# Patient Record
Sex: Female | Born: 1960
Health system: Southern US, Community
[De-identification: ages and names within clinical notes are randomized; demographics above are authoritative.]

## PROBLEM LIST (undated history)

## (undated) DIAGNOSIS — K449 Diaphragmatic hernia without obstruction or gangrene: Secondary | ICD-10-CM

## (undated) DIAGNOSIS — Z973 Presence of spectacles and contact lenses: Secondary | ICD-10-CM

## (undated) DIAGNOSIS — K219 Gastro-esophageal reflux disease without esophagitis: Secondary | ICD-10-CM

## (undated) DIAGNOSIS — N6019 Diffuse cystic mastopathy of unspecified breast: Secondary | ICD-10-CM

## (undated) DIAGNOSIS — L409 Psoriasis, unspecified: Secondary | ICD-10-CM

## (undated) DIAGNOSIS — Z8619 Personal history of other infectious and parasitic diseases: Secondary | ICD-10-CM

## (undated) DIAGNOSIS — C4499 Other specified malignant neoplasm of skin, unspecified: Secondary | ICD-10-CM

## (undated) DIAGNOSIS — Z8719 Personal history of other diseases of the digestive system: Secondary | ICD-10-CM

## (undated) HISTORY — DX: Psoriasis, unspecified: L40.9

## (undated) HISTORY — PX: SHOULDER ARTHROSCOPY W/ ROTATOR CUFF REPAIR: SHX2400

## (undated) HISTORY — PX: TUBAL LIGATION: SHX77

## (undated) HISTORY — DX: Diffuse cystic mastopathy of unspecified breast: N60.19

---

## 1978-02-20 HISTORY — PX: CHOLECYSTECTOMY OPEN: SUR202

## 1999-03-07 ENCOUNTER — Ambulatory Visit (HOSPITAL_BASED_OUTPATIENT_CLINIC_OR_DEPARTMENT_OTHER): Admission: RE | Admit: 1999-03-07 | Discharge: 1999-03-07 | Payer: Self-pay | Admitting: General Surgery

## 1999-03-07 ENCOUNTER — Encounter: Payer: Self-pay | Admitting: General Surgery

## 1999-03-07 HISTORY — PX: EXCISION OF BREAST BIOPSY: SHX5822

## 2001-01-22 ENCOUNTER — Other Ambulatory Visit: Admission: RE | Admit: 2001-01-22 | Discharge: 2001-01-22 | Payer: Self-pay | Admitting: Obstetrics and Gynecology

## 2001-01-23 ENCOUNTER — Encounter: Payer: Self-pay | Admitting: Obstetrics and Gynecology

## 2001-01-23 ENCOUNTER — Ambulatory Visit (HOSPITAL_COMMUNITY): Admission: RE | Admit: 2001-01-23 | Discharge: 2001-01-23 | Payer: Self-pay | Admitting: Neurology

## 2001-02-28 ENCOUNTER — Encounter: Payer: Self-pay | Admitting: Obstetrics and Gynecology

## 2001-02-28 ENCOUNTER — Ambulatory Visit (HOSPITAL_COMMUNITY): Admission: RE | Admit: 2001-02-28 | Discharge: 2001-02-28 | Payer: Self-pay | Admitting: Obstetrics and Gynecology

## 2002-03-13 ENCOUNTER — Ambulatory Visit (HOSPITAL_COMMUNITY): Admission: RE | Admit: 2002-03-13 | Discharge: 2002-03-13 | Payer: Self-pay | Admitting: Obstetrics and Gynecology

## 2002-03-13 ENCOUNTER — Encounter: Payer: Self-pay | Admitting: Obstetrics and Gynecology

## 2002-08-10 ENCOUNTER — Emergency Department (HOSPITAL_COMMUNITY): Admission: EM | Admit: 2002-08-10 | Discharge: 2002-08-10 | Payer: Self-pay | Admitting: *Deleted

## 2002-08-10 ENCOUNTER — Encounter: Payer: Self-pay | Admitting: *Deleted

## 2003-03-18 ENCOUNTER — Ambulatory Visit (HOSPITAL_COMMUNITY): Admission: RE | Admit: 2003-03-18 | Discharge: 2003-03-18 | Payer: Self-pay | Admitting: Obstetrics and Gynecology

## 2003-03-31 ENCOUNTER — Ambulatory Visit (HOSPITAL_COMMUNITY): Admission: RE | Admit: 2003-03-31 | Discharge: 2003-03-31 | Payer: Self-pay | Admitting: Pulmonary Disease

## 2003-07-15 ENCOUNTER — Encounter (HOSPITAL_COMMUNITY): Admission: RE | Admit: 2003-07-15 | Discharge: 2003-08-14 | Payer: Self-pay | Admitting: Orthopedic Surgery

## 2003-08-14 ENCOUNTER — Encounter (HOSPITAL_COMMUNITY): Admission: RE | Admit: 2003-08-14 | Discharge: 2003-09-13 | Payer: Self-pay | Admitting: Orthopedic Surgery

## 2003-12-16 ENCOUNTER — Ambulatory Visit (HOSPITAL_COMMUNITY): Admission: RE | Admit: 2003-12-16 | Discharge: 2003-12-16 | Payer: Self-pay | Admitting: Obstetrics & Gynecology

## 2004-04-06 ENCOUNTER — Ambulatory Visit (HOSPITAL_COMMUNITY): Admission: RE | Admit: 2004-04-06 | Discharge: 2004-04-06 | Payer: Self-pay | Admitting: Obstetrics and Gynecology

## 2005-04-24 ENCOUNTER — Ambulatory Visit (HOSPITAL_COMMUNITY): Admission: RE | Admit: 2005-04-24 | Discharge: 2005-04-24 | Payer: Self-pay | Admitting: Obstetrics and Gynecology

## 2005-06-30 ENCOUNTER — Encounter (INDEPENDENT_AMBULATORY_CARE_PROVIDER_SITE_OTHER): Payer: Self-pay | Admitting: Specialist

## 2005-06-30 ENCOUNTER — Ambulatory Visit (HOSPITAL_COMMUNITY): Admission: RE | Admit: 2005-06-30 | Discharge: 2005-06-30 | Payer: Self-pay | Admitting: Obstetrics & Gynecology

## 2005-06-30 HISTORY — PX: OTHER SURGICAL HISTORY: SHX169

## 2006-04-27 ENCOUNTER — Ambulatory Visit (HOSPITAL_COMMUNITY): Admission: RE | Admit: 2006-04-27 | Discharge: 2006-04-27 | Payer: Self-pay | Admitting: Obstetrics and Gynecology

## 2007-03-28 ENCOUNTER — Other Ambulatory Visit: Admission: RE | Admit: 2007-03-28 | Discharge: 2007-03-28 | Payer: Self-pay | Admitting: Obstetrics and Gynecology

## 2007-04-03 ENCOUNTER — Ambulatory Visit (HOSPITAL_COMMUNITY): Admission: RE | Admit: 2007-04-03 | Discharge: 2007-04-03 | Payer: Self-pay | Admitting: Obstetrics & Gynecology

## 2007-08-19 ENCOUNTER — Ambulatory Visit (HOSPITAL_COMMUNITY): Admission: RE | Admit: 2007-08-19 | Discharge: 2007-08-19 | Payer: Self-pay | Admitting: Obstetrics & Gynecology

## 2008-04-08 ENCOUNTER — Ambulatory Visit (HOSPITAL_COMMUNITY): Admission: RE | Admit: 2008-04-08 | Discharge: 2008-04-08 | Payer: Self-pay | Admitting: Obstetrics and Gynecology

## 2008-04-10 ENCOUNTER — Ambulatory Visit (HOSPITAL_COMMUNITY): Admission: RE | Admit: 2008-04-10 | Discharge: 2008-04-10 | Payer: Self-pay | Admitting: Obstetrics & Gynecology

## 2008-04-15 ENCOUNTER — Other Ambulatory Visit: Admission: RE | Admit: 2008-04-15 | Discharge: 2008-04-15 | Payer: Self-pay | Admitting: Obstetrics and Gynecology

## 2008-09-18 ENCOUNTER — Encounter: Admission: RE | Admit: 2008-09-18 | Discharge: 2008-09-18 | Payer: Self-pay | Admitting: Neurosurgery

## 2009-04-26 ENCOUNTER — Ambulatory Visit (HOSPITAL_COMMUNITY): Admission: RE | Admit: 2009-04-26 | Discharge: 2009-04-26 | Payer: Self-pay | Admitting: Obstetrics and Gynecology

## 2009-07-05 ENCOUNTER — Other Ambulatory Visit: Admission: RE | Admit: 2009-07-05 | Discharge: 2009-07-05 | Payer: Self-pay | Admitting: Obstetrics and Gynecology

## 2010-04-05 ENCOUNTER — Other Ambulatory Visit: Payer: Self-pay | Admitting: Obstetrics and Gynecology

## 2010-04-05 DIAGNOSIS — Z139 Encounter for screening, unspecified: Secondary | ICD-10-CM

## 2010-05-02 ENCOUNTER — Ambulatory Visit (HOSPITAL_COMMUNITY)
Admission: RE | Admit: 2010-05-02 | Discharge: 2010-05-02 | Disposition: A | Discharge status: Home / Self Care | Payer: BC Managed Care – PPO | Source: Ambulatory Visit | Attending: Obstetrics and Gynecology | Admitting: Obstetrics and Gynecology

## 2010-05-02 DIAGNOSIS — Z139 Encounter for screening, unspecified: Secondary | ICD-10-CM

## 2010-05-02 DIAGNOSIS — Z1231 Encounter for screening mammogram for malignant neoplasm of breast: Secondary | ICD-10-CM | POA: Insufficient documentation

## 2010-07-08 NOTE — Op Note (Signed)
[facility]  Patient:    Sarah Avila                         MRN: [id number] Proc. Date: 03/07/99 Adm. Date:  [id number] Attending:  Janalyn Rouse                           Operative Report  PREOPERATIVE DIAGNOSIS:  Abnormal left breast mammogram, with micro calcifications.  POSTOPERATIVE DIAGNOSIS:  Abnormal left breast mammogram, with micro calcification.  OPERATION:  Left breast biopsy with needle localization.  Specimen mammography.  SURGEON: Rose Phi. Maple Hudson, M.D.  ANESTHESIA:  MAC.  OPERATIVE PROCEDURE:  The patient was placed on the operating table and the left breast prepped and draped in the usual fashion.  A curvilinear incision was outlined around the previously placed wire, centered at about the 1:30 position at the left breast.  The area was then thoroughly infiltrated with 1% Xylocaine and adrenalin.  The incision was made and sharp dissection of the wire and surrounding tissue were excised.  Hemostasis obtained with cautery.  Specimen mammography confirmed the removal of the micro calcifications.  Subcuticular closure of 4.0 monocryl and Steri-Strips carried out.  Dressing applied.  The patient was transferred to the recovery room in satisfactory condition, having tolerated the procedure well. DD:  03/07/99 TD:  03/07/99 Job: [id number]

## 2010-07-08 NOTE — Op Note (Signed)
Sarah Avila, Sarah Avila               ACCOUNT NO.:  [id number]   MEDICAL RECORD NO.:  [id number]          PATIENT TYPE:  AMB   LOCATION:  DAY                           FACILITY:  [facility]   PHYSICIAN:  Lazaro Arms, M.D.   DATE OF BIRTH:  21-Aug-1960   DATE OF PROCEDURE:  06/30/2005  DATE OF DISCHARGE:                                 OPERATIVE REPORT   PREOPERATIVE DIAGNOSES:  1.  Menometrorrhagia  2.  Dysmenorrhea.   POSTOPERATIVE DIAGNOSES:  1.  Menometrorrhagia.  2.  Dysmenorrhea.   OPERATION PERFORMED:  1.  Hysteroscopy.  2.  Dilatation and curettage.  3.  Endometrial ablation.   SURGEON:  Lazaro Arms, M.D.   ANESTHESIA:  General endotracheal.   FINDINGS:  The patient had a normal endometrial cavity.  Slight uterine  septum.  Minimal tissue.   DESCRIPTION OF THE OPERATION:  The patient was brought to the operating room  and placed in the supine position.  She underwent general endotracheal  anesthesia.  She was placed in the dorsal lithotomy position, and was  prepped and draped in the usual sterile fashion.   A Graves speculum was placed.  The cervix was grasped.  A hysteroscopy was  performed.  There were normal findings as stated above.  Uterine curettage  was performed.  Endometrial ablation using ThermaChoice 3 balloon was used.  Thirteen milliliters was required.   Total therapy time was 8 minutes 55 seconds.   The patient tolerated the procedure well.   ESTIMATED BLOOD LOSS:  The patient experienced minimal blood loss.   DISPOSITION:  The patient was taken to recovery in good and stable  condition.   COUNTS:  All counts were correct.      Lazaro Arms, M.D.  Electronically Signed     LHE/MEDQ  D:  06/30/2005  T:  07/01/2005  Job:  [id number]

## 2010-07-13 ENCOUNTER — Other Ambulatory Visit (HOSPITAL_COMMUNITY)
Admission: RE | Admit: 2010-07-13 | Discharge: 2010-07-13 | Disposition: A | Discharge status: Home / Self Care | Payer: BC Managed Care – PPO | Source: Ambulatory Visit | Attending: Obstetrics and Gynecology | Admitting: Obstetrics and Gynecology

## 2010-07-13 ENCOUNTER — Other Ambulatory Visit: Payer: Self-pay | Admitting: Adult Health

## 2010-07-13 DIAGNOSIS — Z01419 Encounter for gynecological examination (general) (routine) without abnormal findings: Secondary | ICD-10-CM | POA: Insufficient documentation

## 2011-05-02 ENCOUNTER — Other Ambulatory Visit (HOSPITAL_COMMUNITY): Payer: Self-pay | Admitting: Internal Medicine

## 2011-05-02 DIAGNOSIS — Z139 Encounter for screening, unspecified: Secondary | ICD-10-CM

## 2011-05-08 ENCOUNTER — Ambulatory Visit (HOSPITAL_COMMUNITY)
Admission: RE | Admit: 2011-05-08 | Discharge: 2011-05-08 | Disposition: A | Discharge status: Home / Self Care | Payer: BC Managed Care – PPO | Source: Ambulatory Visit | Attending: Internal Medicine | Admitting: Internal Medicine

## 2011-05-08 DIAGNOSIS — Z139 Encounter for screening, unspecified: Secondary | ICD-10-CM

## 2011-05-08 DIAGNOSIS — Z1231 Encounter for screening mammogram for malignant neoplasm of breast: Secondary | ICD-10-CM | POA: Insufficient documentation

## 2011-07-18 ENCOUNTER — Other Ambulatory Visit (HOSPITAL_COMMUNITY)
Admission: RE | Admit: 2011-07-18 | Discharge: 2011-07-18 | Disposition: A | Discharge status: Home / Self Care | Payer: BC Managed Care – PPO | Source: Ambulatory Visit | Attending: Obstetrics and Gynecology | Admitting: Obstetrics and Gynecology

## 2011-07-18 ENCOUNTER — Other Ambulatory Visit: Payer: Self-pay | Admitting: Adult Health

## 2011-07-18 DIAGNOSIS — Z01419 Encounter for gynecological examination (general) (routine) without abnormal findings: Secondary | ICD-10-CM | POA: Insufficient documentation

## 2011-07-18 DIAGNOSIS — R87810 Cervical high risk human papillomavirus (HPV) DNA test positive: Secondary | ICD-10-CM | POA: Insufficient documentation

## 2011-12-28 ENCOUNTER — Encounter (HOSPITAL_COMMUNITY): Payer: Self-pay | Admitting: *Deleted

## 2011-12-28 ENCOUNTER — Emergency Department (HOSPITAL_COMMUNITY)
Admission: EM | Admit: 2011-12-28 | Discharge: 2011-12-28 | Disposition: A | Discharge status: Home / Self Care | Payer: Worker's Compensation | Attending: Emergency Medicine | Admitting: Emergency Medicine

## 2011-12-28 ENCOUNTER — Emergency Department (HOSPITAL_COMMUNITY): Payer: Worker's Compensation

## 2011-12-28 DIAGNOSIS — W3189XA Contact with other specified machinery, initial encounter: Secondary | ICD-10-CM | POA: Insufficient documentation

## 2011-12-28 DIAGNOSIS — S6720XA Crushing injury of unspecified hand, initial encounter: Secondary | ICD-10-CM

## 2011-12-28 DIAGNOSIS — Z23 Encounter for immunization: Secondary | ICD-10-CM | POA: Insufficient documentation

## 2011-12-28 DIAGNOSIS — S61219A Laceration without foreign body of unspecified finger without damage to nail, initial encounter: Secondary | ICD-10-CM

## 2011-12-28 DIAGNOSIS — F17200 Nicotine dependence, unspecified, uncomplicated: Secondary | ICD-10-CM | POA: Insufficient documentation

## 2011-12-28 DIAGNOSIS — Y9389 Activity, other specified: Secondary | ICD-10-CM | POA: Insufficient documentation

## 2011-12-28 DIAGNOSIS — Y9269 Other specified industrial and construction area as the place of occurrence of the external cause: Secondary | ICD-10-CM | POA: Insufficient documentation

## 2011-12-28 DIAGNOSIS — Y990 Civilian activity done for income or pay: Secondary | ICD-10-CM | POA: Insufficient documentation

## 2011-12-28 DIAGNOSIS — S61209A Unspecified open wound of unspecified finger without damage to nail, initial encounter: Secondary | ICD-10-CM | POA: Insufficient documentation

## 2011-12-28 MED ORDER — LIDOCAINE HCL (PF) 2 % IJ SOLN
10.0000 mL | Freq: Once | INTRAMUSCULAR | Status: AC
  Administered 2011-12-28: 10 mL
  Filled 2011-12-28 (×2): qty 10

## 2011-12-28 MED ORDER — TETANUS-DIPHTH-ACELL PERTUSSIS 5-2.5-18.5 LF-MCG/0.5 IM SUSP
0.5000 mL | Freq: Once | INTRAMUSCULAR | Status: AC
  Administered 2011-12-28: 0.5 mL via INTRAMUSCULAR
  Filled 2011-12-28: qty 0.5

## 2011-12-28 MED ORDER — HYDROCODONE-ACETAMINOPHEN 5-325 MG PO TABS: 1.0000 | ORAL_TABLET | Freq: Four times a day (QID) | ORAL | Status: AC | PRN

## 2011-12-28 MED ORDER — DOUBLE ANTIBIOTIC 500-10000 UNIT/GM EX OINT
TOPICAL_OINTMENT | Freq: Once | CUTANEOUS | Status: AC
  Administered 2011-12-28: 1 via TOPICAL
  Filled 2011-12-28: qty 1

## 2011-12-28 MED ORDER — NAPROXEN 500 MG PO TABS: 500.0000 mg | ORAL_TABLET | Freq: Two times a day (BID) | ORAL | Status: DC

## 2011-12-28 NOTE — ED Provider Notes (Signed)
LACERATION REPAIR Performed by: Burgess Amor Authorized by: Burgess Amor Consent: Verbal consent obtained. Risks and benefits: risks, benefits and alternatives were discussed Consent given by: patient Patient identity confirmed: provided demographic data Prepped and Draped in normal sterile fashion Wound explored  Laceration Location: left dorsal distal index finger  Laceration Length: 1cm  No Foreign Bodies seen or palpated  Anesthesia:   digital block Local anesthetic: lidocaine 2% without epinephrine  Anesthetic total: 2 ml  Irrigation method: syringe Amount of cleaning: standard  Skin closure: ethilon 4-0  Number of sutures: 3  Technique: simple interrupted  Patient tolerance: Patient tolerated the procedure well with no immediate complications.   Burgess Amor, [place] 12/28/11 [phone]

## 2011-12-28 NOTE — ED Provider Notes (Signed)
Medical screening examination/treatment/procedure(s) were conducted as a shared visit with non-physician practitioner(s) and myself.  I personally evaluated the patient during the encounter  Shelda Jakes, MD 12/28/11 2008

## 2011-12-28 NOTE — ED Notes (Signed)
Pt got left hand caught in a machine at work. Mashed left index & middle fingers.

## 2011-12-28 NOTE — ED Notes (Signed)
Patient with no complaints at this time. Respirations even and unlabored. Skin warm/dry. Discharge instructions reviewed with patient at this time. Patient given opportunity to voice concerns/ask questions. Patient discharged at this time and left Emergency Department with steady gait.   

## 2011-12-28 NOTE — ED Provider Notes (Addendum)
History     CSN: [id number]  Arrival date & time 12/28/11  0531   First MD Initiated Contact with Patient 12/28/11 [phone]      Chief Complaint  Patient presents with  . Finger Injury    (Consider location/radiation/quality/duration/timing/severity/associated sxs/prior treatment) The history is provided by the patient.   Patient is a 51 year old the female left hand dominant cut her left hand caught while at work between a [place] and a pulley the index finger and middle finger were crushed. And resulting in some lacerations. No other injuries. Patient's tetanus is not up-to-date double provided today. Patient states the pain is 6/10. Bleeding controlled. History reviewed. No pertinent past medical history.  Past Surgical History  Procedure Date  . Breast lumpectomy   . Cholecystectomy   . Tubal ligation     History reviewed. No pertinent family history.  History  Substance Use Topics  . Smoking status: Current Every Day Smoker    Types: Cigarettes  . Smokeless tobacco: Not on file  . Alcohol Use: No    OB History    Grav Para Term Preterm Abortions TAB SAB Ect Mult Living                  Review of Systems  Constitutional: Negative for fever.  HENT: Negative for neck pain.   Eyes: Negative for redness.  Respiratory: Negative for shortness of breath.   Cardiovascular: Negative for chest pain.  Gastrointestinal: Negative for abdominal pain.  Genitourinary: Negative for dysuria.  Musculoskeletal: Negative for back pain.  Skin: Positive for wound.  Neurological: Negative for headaches.  Hematological: Does not bruise/bleed easily.    Allergies  Review of patient's allergies indicates no known allergies.  Home Medications   Current Outpatient Rx  Name  Route  Sig  Dispense  Refill  . MULTI-VITAMIN/MINERALS PO TABS   Oral   Take 1 tablet by mouth daily.         Marland Kitchen HYDROCODONE-ACETAMINOPHEN 5-325 MG PO TABS   Oral   Take 1-2 tablets by mouth every 6  (six) hours as needed for pain.   10 tablet   0   . NAPROXEN 500 MG PO TABS   Oral   Take 1 tablet (500 mg total) by mouth 2 (two) times daily.   14 tablet   0     BP 117/93  Pulse 54  Temp 97.7 F (36.5 C) (Oral)  Resp 20  Ht 5\' 5"  (1.651 m)  Wt 165 lb (74.844 kg)  BMI 27.46 kg/m2  SpO2 100%  Physical Exam  Nursing note and vitals reviewed. Constitutional: She is oriented to person, place, and time. She appears well-developed and well-nourished.  HENT:  Head: Normocephalic and atraumatic.  Eyes: Conjunctivae normal and EOM are normal. Pupils are equal, round, and reactive to light.  Neck: Normal range of motion. Neck supple.  Cardiovascular: Normal rate, regular rhythm, normal heart sounds and intact distal pulses.   No murmur heard. Pulmonary/Chest: Effort normal and breath sounds normal.  Abdominal: Soft. Bowel sounds are normal. There is no tenderness.  Musculoskeletal: She exhibits tenderness.       Normal except for the injury to left hand. Crush injury to the index and middle finger. Lacerations on the dorsum of the hand. Index finger in the IP phalanx area has a 2 cm transverse laceration just distal to the PIP joint. Patient has good range of motion no tendon involvement. Good cap refill distally and sensation is intact. That is  to both fingers. Multiple other superficial lacerations not requiring suturing.  Neurological: She is alert and oriented to person, place, and time. No cranial nerve deficit. She exhibits normal muscle tone. Coordination normal.  Skin: Skin is warm.    ED Course  Procedures (including critical care time)  Labs Reviewed - No data to display Dg Hand Complete Left  12/28/2011  *RADIOLOGY REPORT*  Clinical Data: Trauma, laceration  LEFT HAND - COMPLETE 3+ VIEW  Comparison: None.  Findings: No fracture or dislocation.  No aggressive osseous lesions.  No radiopaque foreign body.  IMPRESSION: No acute osseous abnormality of the left hand.    Original Report Authenticated By: Jearld Lesch, M.D.      1. Hand crush injury   2. Laceration of finger       MDM  Crush injury to left hand. This occurred at work. The left index and middle fingers were entrapped on a compared belt in between a pulley. No evidence of bony injury. Superficial lacerations to both fingers. Left index finger on the IP phalanx has a 2 cm transverse laceration not involving the tendons and will require suture.x-ray showed no evidence of fracture.  Suture repair will be done by the mid-level. This will be a shared visit.  Sutures can be removed in 7-10 days. Orthopedic referral provided as needed. The patient also has primary care Dr. She can followup with wound care discussed.  Suture repair to be done by the mid-level as stated above. Tetanus given in the emergency department.      Medical screening examination/treatment/procedure(s) were conducted as a shared visit with non-physician practitioner(s) and myself.  I personally evaluated the patient during the encounter        Shelda Jakes, MD 12/28/11 1610  Shelda Jakes, MD 12/28/11 9604  Shelda Jakes, MD 12/28/11 2007

## 2012-04-29 ENCOUNTER — Other Ambulatory Visit: Payer: Self-pay | Admitting: Obstetrics and Gynecology

## 2012-04-29 DIAGNOSIS — Z139 Encounter for screening, unspecified: Secondary | ICD-10-CM

## 2012-05-09 ENCOUNTER — Ambulatory Visit (HOSPITAL_COMMUNITY)
Admission: RE | Admit: 2012-05-09 | Discharge: 2012-05-09 | Disposition: A | Discharge status: Home / Self Care | Payer: BC Managed Care – PPO | Source: Ambulatory Visit | Attending: Obstetrics and Gynecology | Admitting: Obstetrics and Gynecology

## 2012-05-09 DIAGNOSIS — Z1231 Encounter for screening mammogram for malignant neoplasm of breast: Secondary | ICD-10-CM | POA: Insufficient documentation

## 2012-05-09 DIAGNOSIS — Z139 Encounter for screening, unspecified: Secondary | ICD-10-CM

## 2012-08-06 ENCOUNTER — Other Ambulatory Visit: Payer: Self-pay | Admitting: Adult Health

## 2012-08-07 ENCOUNTER — Encounter: Payer: Self-pay | Admitting: *Deleted

## 2012-08-08 ENCOUNTER — Encounter: Payer: Self-pay | Admitting: Adult Health

## 2012-08-08 ENCOUNTER — Ambulatory Visit (INDEPENDENT_AMBULATORY_CARE_PROVIDER_SITE_OTHER): Payer: BC Managed Care – PPO | Admitting: Adult Health

## 2012-08-08 ENCOUNTER — Other Ambulatory Visit (HOSPITAL_COMMUNITY)
Admission: RE | Admit: 2012-08-08 | Discharge: 2012-08-08 | Disposition: A | Discharge status: Home / Self Care | Payer: BC Managed Care – PPO | Source: Ambulatory Visit | Attending: Adult Health | Admitting: Adult Health

## 2012-08-08 VITALS — BP 124/86 | HR 74 | Ht 65.5 in | Wt 174.0 lb

## 2012-08-08 DIAGNOSIS — F17200 Nicotine dependence, unspecified, uncomplicated: Secondary | ICD-10-CM | POA: Insufficient documentation

## 2012-08-08 DIAGNOSIS — Z1151 Encounter for screening for human papillomavirus (HPV): Secondary | ICD-10-CM | POA: Insufficient documentation

## 2012-08-08 DIAGNOSIS — Z1212 Encounter for screening for malignant neoplasm of rectum: Secondary | ICD-10-CM

## 2012-08-08 DIAGNOSIS — Z01419 Encounter for gynecological examination (general) (routine) without abnormal findings: Secondary | ICD-10-CM

## 2012-08-08 DIAGNOSIS — B977 Papillomavirus as the cause of diseases classified elsewhere: Secondary | ICD-10-CM

## 2012-08-08 LAB — LIPID PANEL
Cholesterol: 198 mg/dL (ref 0–200)
HDL: 50 mg/dL (ref >39)
LDL Cholesterol: 139 mg/dL — ABNORMAL HIGH (ref 0–99)
Total CHOL/HDL Ratio: 4 Ratio
Triglycerides: 45 mg/dL (ref <150)
VLDL: 9 mg/dL (ref 0–40)

## 2012-08-08 LAB — CBC
HCT: 38.1 % (ref 36.0–46.0)
Hemoglobin: 13.3 g/dL (ref 12.0–15.0)
MCH: 30.4 pg (ref 26.0–34.0)
MCHC: 34.9 g/dL (ref 30.0–36.0)
MCV: 87 fL (ref 78.0–100.0)
Platelets: 176 10*3/uL (ref 150–400)
RBC: 4.38 MIL/uL (ref 3.87–5.11)
RDW: 14.9 % (ref 11.5–15.5)
WBC: 5.6 10*3/uL (ref 4.0–10.5)

## 2012-08-08 LAB — COMPREHENSIVE METABOLIC PANEL
ALT: 15 U/L (ref 0–35)
AST: 19 U/L (ref 0–37)
Albumin: 4.1 g/dL (ref 3.5–5.2)
Alkaline Phosphatase: 44 U/L (ref 39–117)
BUN: 20 mg/dL (ref 6–23)
CO2: 27 mEq/L (ref 19–32)
Calcium: 9.3 mg/dL (ref 8.4–10.5)
Chloride: 104 mEq/L (ref 96–112)
Creat: 0.73 mg/dL (ref 0.50–1.10)
Glucose, Bld: 81 mg/dL (ref 70–99)
Potassium: 4.5 mEq/L (ref 3.5–5.3)
Sodium: 141 mEq/L (ref 135–145)
Total Bilirubin: 1.2 mg/dL (ref 0.3–1.2)
Total Protein: 6.2 g/dL (ref 6.0–8.3)

## 2012-08-08 LAB — HEMOCCULT GUIAC POC 1CARD (OFFICE): Fecal Occult Blood, POC: NEGATIVE

## 2012-08-08 LAB — TSH: TSH: 0.858 u[IU]/mL (ref 0.350–4.500)

## 2012-08-08 MED ORDER — BUPROPION HCL ER (SR) 150 MG PO TB12: ORAL_TABLET | ORAL | Status: DC

## 2012-08-08 NOTE — Patient Instructions (Addendum)
Physical in 1 year  Mammogram yearly Colonoscopy 2021 Call about labs

## 2012-08-08 NOTE — Progress Notes (Signed)
Patient ID: Sarah Avila, female   DOB: 10-May-1960, 52 y.o.   MRN: [id number] History of Present Illness: Sarah Avila is a 52 year old white female in for her pap and physical. She is working 12 hour shifts.Has occasional hot flash. Last year's pap +HPV  Current Medications, Allergies, Past Medical History, Past Surgical History, Family History and Social History were reviewed in [employer] record.     Review of Systems: Patient denies any headaches, blurred vision, shortness of breath, chest pain, abdominal pain, problems with bowel movements, urination, or intercourse, some occasional discomfort but uses lubricant.No joint pain some stress at work, wants to quit smoking.    Physical Exam:BP 124/86  Pulse 74  Ht 5' 5.5" (1.664 m)  Wt 174 lb (78.926 kg)  BMI 28.5 kg/m2 General:  Well developed, well nourished, no acute distress Skin:  Warm and dry,tan Neck:  Midline trachea, normal thyroid Lungs; Clear to auscultation bilaterally Breast:  No dominant palpable mass, retraction, or nipple discharge Cardiovascular: Regular rate and rhythm Abdomen:  Soft, non tender, no hepatosplenomegaly Pelvic:  External genitalia is normal in appearance.  The vagina is normal in appearance. She has a mild cystocele.The cervix is bulbous.Pap performed with HPV  Uterus is felt to be normal size, shape, and contour.  No   adnexal masses or tenderness noted. Rectal: Good sphincter tone, no polyps, or hemorrhoids felt.  Hemoccult negative. Extremities:  No swelling noted Psych:  Alert and cooperative and seems happy   Impression: Yearly gyn exam History HPV Nicotine addiction   Plan: Physical in 1 year  Mammogram yearly Colonoscopy in 2021 Rx Wellbutrin 150 mg SR 1 daily #30 with 6 refills. Check CBC,CMP,TSH and lipids

## 2012-08-16 ENCOUNTER — Telehealth: Payer: Self-pay | Admitting: Adult Health

## 2012-08-16 NOTE — Telephone Encounter (Signed)
Pt aware pap normal no HPV

## 2012-12-26 ENCOUNTER — Other Ambulatory Visit: Payer: Self-pay

## 2013-03-22 ENCOUNTER — Other Ambulatory Visit: Payer: Self-pay | Admitting: Adult Health

## 2013-04-15 ENCOUNTER — Other Ambulatory Visit: Payer: Self-pay | Admitting: Obstetrics and Gynecology

## 2013-04-15 DIAGNOSIS — Z139 Encounter for screening, unspecified: Secondary | ICD-10-CM

## 2013-05-12 ENCOUNTER — Ambulatory Visit (HOSPITAL_COMMUNITY)
Admission: RE | Admit: 2013-05-12 | Discharge: 2013-05-12 | Disposition: A | Discharge status: Home / Self Care | Payer: BC Managed Care – PPO | Source: Ambulatory Visit | Attending: Obstetrics and Gynecology | Admitting: Obstetrics and Gynecology

## 2013-05-12 DIAGNOSIS — Z139 Encounter for screening, unspecified: Secondary | ICD-10-CM

## 2013-05-12 DIAGNOSIS — Z1231 Encounter for screening mammogram for malignant neoplasm of breast: Secondary | ICD-10-CM | POA: Insufficient documentation

## 2013-12-22 ENCOUNTER — Encounter: Payer: Self-pay | Admitting: Adult Health

## 2014-05-20 ENCOUNTER — Other Ambulatory Visit: Payer: Self-pay | Admitting: Obstetrics and Gynecology

## 2014-05-20 DIAGNOSIS — Z1231 Encounter for screening mammogram for malignant neoplasm of breast: Secondary | ICD-10-CM

## 2014-05-27 ENCOUNTER — Encounter (HOSPITAL_COMMUNITY): Payer: Self-pay

## 2014-05-27 ENCOUNTER — Ambulatory Visit (HOSPITAL_COMMUNITY)
Admission: RE | Admit: 2014-05-27 | Discharge: 2014-05-27 | Disposition: A | Discharge status: Home / Self Care | Payer: 59 | Source: Ambulatory Visit | Attending: Obstetrics and Gynecology | Admitting: Obstetrics and Gynecology

## 2014-05-27 DIAGNOSIS — Z1231 Encounter for screening mammogram for malignant neoplasm of breast: Secondary | ICD-10-CM | POA: Diagnosis present

## 2014-08-10 ENCOUNTER — Ambulatory Visit (INDEPENDENT_AMBULATORY_CARE_PROVIDER_SITE_OTHER): Payer: 59 | Admitting: Adult Health

## 2014-08-10 ENCOUNTER — Encounter: Payer: Self-pay | Admitting: Adult Health

## 2014-08-10 VITALS — BP 140/80 | HR 76 | Ht 65.0 in | Wt 191.5 lb

## 2014-08-10 DIAGNOSIS — Z01419 Encounter for gynecological examination (general) (routine) without abnormal findings: Secondary | ICD-10-CM | POA: Diagnosis not present

## 2014-08-10 DIAGNOSIS — Z1212 Encounter for screening for malignant neoplasm of rectum: Secondary | ICD-10-CM

## 2014-08-10 LAB — HEMOCCULT GUIAC POC 1CARD (OFFICE): Fecal Occult Blood, POC: NEGATIVE

## 2014-08-10 NOTE — Patient Instructions (Signed)
Pap and physical in 1 year Mammogram yearly Labs with PCP Colonoscopy at 24

## 2014-08-10 NOTE — Progress Notes (Signed)
Patient ID: Sarah Avila, female   DOB: 05-Dec-1960, 54 y.o.   MRN: [id number] History of Present Illness:  Sarah Avila is a 54 year old white female, in for well woman gyn exam,had normal pap with negative HPV 08/08/12. PCP is Dr Sarah Avila.  Current Medications, Allergies, Past Medical History, Past Surgical History, Family History and Social History were reviewed in [employer] record.     Review of Systems: Patient denies any headaches, hearing loss, blurred vision, shortness of breath, chest pain, abdominal pain, problems with bowel movements, urination, or intercourse. No joint pain or mood swings.Pt is tired at times, had labs with PCP.Has occasional hot flash.    Physical Exam:BP 140/80 mmHg  Pulse 76  Ht 5\' 5"  (1.651 m)  Wt 191 lb 8 oz (86.864 kg)  BMI 31.87 kg/m2 General:  Well developed, well nourished, no acute distress Skin:  Warm and dry Neck:  Midline trachea, normal thyroid, good ROM, no lymphadenopathy Lungs; Clear to auscultation bilaterally Breast:  No dominant palpable mass, retraction, or nipple discharge Cardiovascular: Regular rate and rhythm Abdomen:  Soft, non tender, no hepatosplenomegaly Pelvic:  External genitalia is normal in appearance, no lesions.  The vagina is normal in appearance. Urethra has no lesions or masses. The cervix is bulbous.  Uterus is felt to be normal size, shape, and contour.  No adnexal masses or tenderness noted.Bladder is non tender, no masses felt. Rectal: Good sphincter tone, no polyps, or hemorrhoids felt.  Hemoccult negative. Extremities/musculoskeletal:  No swelling or varicosities noted, no clubbing or cyanosis Psych:  No mood changes, alert and cooperative,seems happy   Impression: Well woman gyn exam no pap    Plan: Pap and physical in 1 year Mammogram yearly Colonoscopy at 67  Labs with PCP

## 2015-03-31 ENCOUNTER — Ambulatory Visit (INDEPENDENT_AMBULATORY_CARE_PROVIDER_SITE_OTHER): Payer: 59 | Admitting: Adult Health

## 2015-03-31 ENCOUNTER — Encounter: Payer: Self-pay | Admitting: Adult Health

## 2015-03-31 VITALS — BP 130/72 | HR 70 | Ht 64.75 in | Wt 199.0 lb

## 2015-03-31 DIAGNOSIS — K5900 Constipation, unspecified: Secondary | ICD-10-CM | POA: Insufficient documentation

## 2015-03-31 DIAGNOSIS — R1032 Left lower quadrant pain: Secondary | ICD-10-CM | POA: Diagnosis not present

## 2015-03-31 NOTE — Patient Instructions (Signed)
Pelvic Pain, Female Female pelvic pain can be caused by many different things and start from a variety of places. Pelvic pain refers to pain that is located in the lower half of the abdomen and between your hips. The pain may occur over a short period of time (acute) or may be reoccurring (chronic). The cause of pelvic pain may be related to disorders affecting the female reproductive organs (gynecologic), but it may also be related to the bladder, kidney stones, an intestinal complication, or muscle or skeletal problems. Getting help right away for pelvic pain is important, especially if there has been severe, sharp, or a sudden onset of unusual pain. It is also important to get help right away because some types of pelvic pain can be life threatening.  CAUSES  Below are only some of the causes of pelvic pain. The causes of pelvic pain can be in one of several categories.   Gynecologic.  Pelvic inflammatory disease.  Sexually transmitted infection.  Ovarian cyst or a twisted ovarian ligament (ovarian torsion).  Uterine lining that grows outside the uterus (endometriosis).  Fibroids, cysts, or tumors.  Ovulation.  Pregnancy.  Pregnancy that occurs outside the uterus (ectopic pregnancy).  Miscarriage.  Labor.  Abruption of the placenta or ruptured uterus.  Infection.  Uterine infection (endometritis).  Bladder infection.  Diverticulitis.  Miscarriage related to a uterine infection (septic abortion).  Bladder.  Inflammation of the bladder (cystitis).  Kidney stone(s).  Gastrointestinal.  Constipation.  Diverticulitis.  Neurologic.  Trauma.  Feeling pelvic pain because of mental or emotional causes (psychosomatic).  Cancers of the bowel or pelvis. EVALUATION  Your caregiver will want to take a careful history of your concerns. This includes recent changes in your health, a careful gynecologic history of your periods (menses), and a sexual history. Obtaining  your family history and medical history is also important. Your caregiver may suggest a pelvic exam. A pelvic exam will help identify the location and severity of the pain. It also helps in the evaluation of which organ system may be involved. In order to identify the cause of the pelvic pain and be properly treated, your caregiver may order tests. These tests may include:   A pregnancy test.  Pelvic ultrasonography.  An X-ray exam of the abdomen.  A urinalysis or evaluation of vaginal discharge.  Blood tests. HOME CARE INSTRUCTIONS   Only take over-the-counter or prescription medicines for pain, discomfort, or fever as directed by your caregiver.   Rest as directed by your caregiver.   Eat a balanced diet.   Drink enough fluids to make your urine clear or pale yellow, or as directed.   Avoid sexual intercourse if it causes pain.   Apply warm or cold compresses to the lower abdomen depending on which one helps the pain.   Avoid stressful situations.   Keep a journal of your pelvic pain. Write down when it started, where the pain is located, and if there are things that seem to be associated with the pain, such as food or your menstrual cycle.  Follow up with your caregiver as directed.  SEEK MEDICAL CARE IF:  Your medicine does not help your pain.  You have abnormal vaginal discharge. SEEK IMMEDIATE MEDICAL CARE IF:   You have heavy bleeding from the vagina.   Your pelvic pain increases.   You feel light-headed or faint.   You have chills.   You have pain with urination or blood in your urine.   You have uncontrolled  diarrhea or vomiting.   You have a fever or persistent symptoms for more than 3 days.  You have a fever and your symptoms suddenly get worse.   You are being physically or sexually abused.   This information is not intended to replace advice given to you by your health care provider. Make sure you discuss any questions you have with  your health care provider.   Document Released: 01/04/2004 Document Revised: 10/28/2014 Document Reviewed: 05/29/2011 Elsevier Interactive Patient Education [employer]. Return in 1 day for gyn [place]

## 2015-03-31 NOTE — Progress Notes (Signed)
Subjective:     Patient ID: Sarah Avila, female   DOB: 11-Dec-1960, 55 y.o.   MRN: [id number]  HPI Sarah Avila is a 55 year old white female in complaining of pain in LLQ for about 3 months, she denies any nausea, vomiting or diarrhea, she has had some constipation.  Review of Systems Patient denies any headaches, hearing loss, fatigue, blurred vision, shortness of breath, chest pain, problems with  urination, or intercourse. No joint pain or mood swings. See HPI for positives. Reviewed past medical,surgical, social and family history. Reviewed medications and allergies.     Objective:   Physical Exam BP 130/72 mmHg  Pulse 70  Ht 5' 4.75" (1.645 m)  Wt 199 lb (90.266 kg)  BMI 33.36 kg/m2   Skin warm and dry.Pelvic: external genitalia is normal in appearance no lesions, vagina:pale with loss of moisture and rugae,urethra has no lesions or masses noted, cervix:smooth and bulbous,with relaxation, uterus: normal size, shape and contour, non tender, no masses felt, adnexa: no masses, LLQ tenderness noted. Bladder is non tender and no masses felt. Abdomen soft and non tender, no HSM, no CVAT.Had colonoscopy at 55.Will get [place] ASAP to assess ovaries and uterus. Face time 15 minutes.  Assessment:     LLQ pain Constipation     Plan:     Return in 1 day for gyn [place] Review handout on pelvic pain

## 2015-04-01 ENCOUNTER — Ambulatory Visit (INDEPENDENT_AMBULATORY_CARE_PROVIDER_SITE_OTHER): Payer: 59

## 2015-04-01 DIAGNOSIS — R1032 Left lower quadrant pain: Secondary | ICD-10-CM | POA: Diagnosis not present

## 2015-04-01 DIAGNOSIS — N854 Malposition of uterus: Secondary | ICD-10-CM

## 2015-04-01 NOTE — Progress Notes (Signed)
PELVIC US TA/TV homogenous anteverted uterus,EEC 2.63mm, .4 x .3 x .3cm calcification in the LUS w/in the endometrial cavity,normal ov's bilat (mobile),no free fluid,no pain

## 2015-04-05 ENCOUNTER — Telehealth: Payer: Self-pay | Admitting: Adult Health

## 2015-04-05 NOTE — Telephone Encounter (Signed)
Pt aware US normal  

## 2015-04-05 NOTE — Telephone Encounter (Signed)
Pt called stating that she would like a phone call from jennifer regarding her results of her ultrasound. Please contact pt

## 2015-05-31 ENCOUNTER — Other Ambulatory Visit: Payer: Self-pay | Admitting: Obstetrics and Gynecology

## 2015-05-31 DIAGNOSIS — Z1231 Encounter for screening mammogram for malignant neoplasm of breast: Secondary | ICD-10-CM

## 2015-06-07 ENCOUNTER — Ambulatory Visit (HOSPITAL_COMMUNITY)
Admission: RE | Admit: 2015-06-07 | Discharge: 2015-06-07 | Disposition: A | Discharge status: Home / Self Care | Payer: 59 | Source: Ambulatory Visit | Attending: Obstetrics and Gynecology | Admitting: Obstetrics and Gynecology

## 2015-06-07 DIAGNOSIS — Z1231 Encounter for screening mammogram for malignant neoplasm of breast: Secondary | ICD-10-CM

## 2015-11-05 ENCOUNTER — Other Ambulatory Visit: Payer: 59 | Admitting: Adult Health

## 2016-01-07 ENCOUNTER — Encounter (INDEPENDENT_AMBULATORY_CARE_PROVIDER_SITE_OTHER): Payer: Self-pay | Admitting: Internal Medicine

## 2016-01-19 ENCOUNTER — Encounter (INDEPENDENT_AMBULATORY_CARE_PROVIDER_SITE_OTHER): Payer: Self-pay | Admitting: *Deleted

## 2016-01-19 ENCOUNTER — Other Ambulatory Visit (INDEPENDENT_AMBULATORY_CARE_PROVIDER_SITE_OTHER): Payer: Self-pay | Admitting: Internal Medicine

## 2016-01-19 ENCOUNTER — Encounter (INDEPENDENT_AMBULATORY_CARE_PROVIDER_SITE_OTHER): Payer: Self-pay

## 2016-01-19 ENCOUNTER — Encounter (INDEPENDENT_AMBULATORY_CARE_PROVIDER_SITE_OTHER): Payer: Self-pay | Admitting: Internal Medicine

## 2016-01-19 ENCOUNTER — Ambulatory Visit (INDEPENDENT_AMBULATORY_CARE_PROVIDER_SITE_OTHER): Payer: 59 | Admitting: Internal Medicine

## 2016-01-19 VITALS — BP 110/62 | HR 72 | Temp 98.2°F | Ht 65.5 in | Wt 197.9 lb

## 2016-01-19 DIAGNOSIS — R1310 Dysphagia, unspecified: Secondary | ICD-10-CM

## 2016-01-19 DIAGNOSIS — R1319 Other dysphagia: Secondary | ICD-10-CM | POA: Insufficient documentation

## 2016-01-19 NOTE — Progress Notes (Signed)
   Subjective:    Patient ID: Sarah Avila, female    DOB: 1960/04/24, 55 y.o.   MRN: [id number]  HPI Referred by Dr. Nevada Crane for possible esophageal stricture. She tells me when she swallows it hurts in her mid chest pain. Sometimes it feels like the food is lodging in her esophagus. Breads are slow to go down.  If she drinks something hot, it burns and feels raw. If she drinks something cold, her esophagus feels irritated. Symptoms x 6-8 months.  Symptoms resolved and then come back. She is careful what she eats. She avoids spicy foods and tomatos because it will give her heart burn. She tells me the Protonix has helped with her acid reflux. She started about 2 1/2 weeks.  No weight loss. Appetite is good.  Take Aleve about once a week. Her [place] colon[place] at age 34 [place] and was [place].  12/20/2015 ALP 61, AST 18. ALT 17, H and H 13.8 and 40.6, MC 90.0, Platelet ct 178    Review of Systems Past Medical History:  Diagnosis Date  . Anxiety   . Constipation 03/31/2015  . Cystocele   . Fibrocystic breast   . Human papilloma virus   . Hx of constipation   . Hyperlipidemia   . LLQ pain 03/31/2015  . Nicotine addiction 08/08/2012  . Psoriasis     Past Surgical History:  Procedure Laterality Date  . APPENDECTOMY    . BREAST LUMPECTOMY    . CHOLECYSTECTOMY    . ENDOMETRIAL ABLATION    . TUBAL LIGATION      No Known Allergies  Current Outpatient Prescriptions on File Prior to Visit  Medication Sig Dispense Refill  . Cholecalciferol (VITAMIN D-3 PO) Take 5,000 Units by mouth daily.     No current facility-administered medications on file prior to visit.        Objective:   Physical Exam Blood pressure 110/62, pulse 72, temperature 98.2 F (36.8 C), height 5' 5.5" (1.664 m), weight 197 lb 14.4 oz (89.8 kg). Alert and oriented. Skin warm and dry. Oral mucosa is moist.   . Sclera anicteric, conjunctivae is pink. Thyroid not enlarged. No cervical lymphadenopathy. Lungs  clear. Heart regular rate and rhythm.  Abdomen is soft. Bowel sounds are positive. No hepatomegaly. No abdominal masses felt. No tenderness.  No edema to lower extremities.          Assessment & Plan:  Dysphagia. Esophageal stricture needs to be ruled out.  EGD/ED. The risks and benefits such as perforation, bleeding, and infection were reviewed with the patient and is agreeable.

## 2016-01-19 NOTE — Patient Instructions (Signed)
The risks and benefits such as perforation, bleeding, and infection were reviewed with the patient and is agreeable. 

## 2016-02-01 ENCOUNTER — Encounter: Payer: Self-pay | Admitting: Adult Health

## 2016-02-01 ENCOUNTER — Other Ambulatory Visit (HOSPITAL_COMMUNITY)
Admission: RE | Admit: 2016-02-01 | Discharge: 2016-02-01 | Disposition: A | Discharge status: Home / Self Care | Payer: 59 | Source: Ambulatory Visit | Attending: Adult Health | Admitting: Adult Health

## 2016-02-01 ENCOUNTER — Ambulatory Visit (INDEPENDENT_AMBULATORY_CARE_PROVIDER_SITE_OTHER): Payer: 59 | Admitting: Adult Health

## 2016-02-01 VITALS — BP 110/60 | HR 76 | Ht 65.0 in | Wt 200.0 lb

## 2016-02-01 DIAGNOSIS — Z1151 Encounter for screening for human papillomavirus (HPV): Secondary | ICD-10-CM | POA: Diagnosis present

## 2016-02-01 DIAGNOSIS — K5900 Constipation, unspecified: Secondary | ICD-10-CM

## 2016-02-01 DIAGNOSIS — Z01419 Encounter for gynecological examination (general) (routine) without abnormal findings: Secondary | ICD-10-CM | POA: Insufficient documentation

## 2016-02-01 DIAGNOSIS — Z01411 Encounter for gynecological examination (general) (routine) with abnormal findings: Secondary | ICD-10-CM | POA: Diagnosis not present

## 2016-02-01 DIAGNOSIS — Z1211 Encounter for screening for malignant neoplasm of colon: Secondary | ICD-10-CM

## 2016-02-01 LAB — HEMOCCULT GUIAC POC 1CARD (OFFICE): Fecal Occult Blood, POC: NEGATIVE

## 2016-02-01 NOTE — Progress Notes (Signed)
Patient ID: Sarah Avila, female   DOB: June 08, 1960, 55 y.o.   MRN: [id number] History of Present Illness:  Sarah Avila is a 55 year old white female in for a well woman gyn exam and pap. She declines the flu shot. PCP is Dr Nevada Crane.  Current Medications, Allergies, Past Medical History, Past Surgical History, Family History and Social History were reviewed in [employer] record.     Review of Systems: Patient denies any headaches, hearing loss, fatigue, blurred vision, shortness of breath, chest pain, abdominal pain, problems with bowel movements(has constipation at times and miralax works), urination, or intercourse. No joint pain or mood swings. She has had some pain in shoulders at times.    Physical Exam:BP 110/60 (BP Location: Left Arm, Patient Position: Sitting, Cuff Size: Large)   Pulse 76   Ht 5\' 5"  (1.651 m)   Wt 200 lb (90.7 kg)   BMI 33.28 kg/m  General:  Well developed, well nourished, no acute distress Skin:  Warm and dry Neck:  Midline trachea, normal thyroid, good ROM, no lymphadenopathy Lungs; Clear to auscultation bilaterally Breast:  No dominant palpable mass, retraction, or nipple discharge Cardiovascular: Regular rate and rhythm Abdomen:  Soft, non tender, no hepatosplenomegaly Pelvic:  External genitalia is normal in appearance, no lesions.  The vagina is normal in appearance. Urethra has no lesions or masses. The cervix is smooth,pap with HPV perfomred.  Uterus is felt to be normal size, shape, and contour.  No adnexal masses or tenderness noted.Bladder is non tender, no masses felt. Rectal: Good sphincter tone, no polyps, or hemorrhoids felt.  Hemoccult negative. Extremities/musculoskeletal:  No swelling or varicosities noted, no clubbing or cyanosis Psych:  No mood changes, alert and cooperative,seems happy PHQ 9 score 11, she denies depression,it is just this time of year  Impression: 1. Encounter for gynecological examination with  Papanicolaou smear of cervix   2. Constipation, unspecified constipation type       Plan: Physical in  1 year Pap in 2020 Mammogram yearly Colonoscopy per [facility] with PCP

## 2016-02-01 NOTE — Patient Instructions (Signed)
Physical in  1 year Pap in 2020 Mammogram yearly Colonoscopy per [facility] with PCP

## 2016-02-02 LAB — CYTOLOGY - PAP
Diagnosis: NEGATIVE
HPV: NOT DETECTED

## 2016-03-22 ENCOUNTER — Encounter (HOSPITAL_COMMUNITY)
Admission: RE | Disposition: A | Discharge status: Home / Self Care | Payer: Self-pay | Source: Ambulatory Visit | Attending: Internal Medicine

## 2016-03-22 ENCOUNTER — Ambulatory Visit (HOSPITAL_COMMUNITY)
Admission: RE | Admit: 2016-03-22 | Discharge: 2016-03-22 | Disposition: A | Discharge status: Home / Self Care | Payer: Managed Care, Other (non HMO) | Source: Ambulatory Visit | Attending: Internal Medicine | Admitting: Internal Medicine

## 2016-03-22 ENCOUNTER — Encounter (HOSPITAL_COMMUNITY): Payer: Self-pay | Admitting: *Deleted

## 2016-03-22 DIAGNOSIS — R1310 Dysphagia, unspecified: Secondary | ICD-10-CM | POA: Insufficient documentation

## 2016-03-22 DIAGNOSIS — K449 Diaphragmatic hernia without obstruction or gangrene: Secondary | ICD-10-CM | POA: Insufficient documentation

## 2016-03-22 DIAGNOSIS — K210 Gastro-esophageal reflux disease with esophagitis: Secondary | ICD-10-CM

## 2016-03-22 DIAGNOSIS — F17210 Nicotine dependence, cigarettes, uncomplicated: Secondary | ICD-10-CM | POA: Insufficient documentation

## 2016-03-22 DIAGNOSIS — K2270 Barrett's esophagus without dysplasia: Secondary | ICD-10-CM | POA: Insufficient documentation

## 2016-03-22 DIAGNOSIS — R1319 Other dysphagia: Secondary | ICD-10-CM

## 2016-03-22 DIAGNOSIS — R1314 Dysphagia, pharyngoesophageal phase: Secondary | ICD-10-CM | POA: Diagnosis not present

## 2016-03-22 DIAGNOSIS — K219 Gastro-esophageal reflux disease without esophagitis: Secondary | ICD-10-CM

## 2016-03-22 DIAGNOSIS — K228 Other specified diseases of esophagus: Secondary | ICD-10-CM

## 2016-03-22 DIAGNOSIS — Z79899 Other long term (current) drug therapy: Secondary | ICD-10-CM | POA: Insufficient documentation

## 2016-03-22 HISTORY — PX: BIOPSY: SHX5522

## 2016-03-22 HISTORY — PX: ESOPHAGOGASTRODUODENOSCOPY: SHX5428

## 2016-03-22 HISTORY — PX: ESOPHAGEAL DILATION: SHX303

## 2016-03-22 SURGERY — EGD (ESOPHAGOGASTRODUODENOSCOPY)
Anesthesia: Moderate Sedation

## 2016-03-22 MED ORDER — MIDAZOLAM HCL 5 MG/5ML IJ SOLN
INTRAMUSCULAR | Status: AC
  Filled 2016-03-22: qty 10

## 2016-03-22 MED ORDER — MEPERIDINE HCL 50 MG/ML IJ SOLN
INTRAMUSCULAR | Status: AC
  Filled 2016-03-22: qty 1

## 2016-03-22 MED ORDER — BUTAMBEN-TETRACAINE-BENZOCAINE 2-2-14 % EX AERO
INHALATION_SPRAY | CUTANEOUS | Status: DC | PRN
  Administered 2016-03-22: 2 via TOPICAL

## 2016-03-22 MED ORDER — STERILE WATER FOR IRRIGATION IR SOLN
Status: DC | PRN
  Administered 2016-03-22: 2.5 mL

## 2016-03-22 MED ORDER — SODIUM CHLORIDE 0.9 % IV SOLN
INTRAVENOUS | Status: DC
  Administered 2016-03-22: 1000 mL via INTRAVENOUS

## 2016-03-22 MED ORDER — PANTOPRAZOLE SODIUM 40 MG PO TBEC: 40.0000 mg | DELAYED_RELEASE_TABLET | Freq: Two times a day (BID) | ORAL | 5 refills | Status: DC

## 2016-03-22 MED ORDER — MIDAZOLAM HCL 5 MG/5ML IJ SOLN
INTRAMUSCULAR | Status: DC | PRN
  Administered 2016-03-22 (×3): 2 mg via INTRAVENOUS

## 2016-03-22 MED ORDER — MEPERIDINE HCL 50 MG/ML IJ SOLN
INTRAMUSCULAR | Status: DC | PRN
  Administered 2016-03-22 (×2): 25 mg via INTRAVENOUS

## 2016-03-22 NOTE — H&P (Signed)
Sarah Avila is an 56 y.o. female.   Chief Complaint: Patient is here for EGD and ED. HPI: Patient is 56 year old Caucasian female who presents with one-year history of intermittent solid food dysphagia. She has difficulty mostly with bread. She points to mid sternal areas site of bolus obstruction. Takes 2-3 minutes before she has relief. She's never had regurgitate food in order to get relief. She has intermittent heartburn and uses pantoprazole on demand. He denies nausea vomiting abdominal pain or melena.  Past Medical History:  Diagnosis Date  . Anxiety   . Constipation 03/31/2015  . Cystocele   . Fibrocystic breast    GERD   . Hx of constipation   . Hyperlipidemia     03/31/2015    08/08/2012  . Psoriasis     Past Surgical History:  Procedure Laterality Date  . APPENDECTOMY    . BREAST LUMPECTOMY    . CHOLECYSTECTOMY    . ENDOMETRIAL ABLATION    . TUBAL LIGATION      Family History  Problem Relation Age of Onset  . Hypertension Mother   . Osteoporosis Mother   . Cancer Father     prostate cancer   Social History:  reports that she has been smoking Cigarettes.  She has been smoking about 0.00 packs per day for the past 20.00 years. She has never used smokeless tobacco. She reports that she drinks alcohol. She reports that she does not use drugs.  Allergies: No Known Allergies  Medications Prior to Admission  Medication Sig Dispense Refill  . Calcium-Magnesium-Zinc 167-83-8 MG TABS Take 1 tablet by mouth daily.     . Cholecalciferol (VITAMIN D-3 PO) Take 5,000 Units by mouth daily.    . Cyanocobalamin (VITAMELTS ENERGY VITAMIN B-12 PO) Take 1 tablet by mouth 2 (two) times daily.     . pantoprazole (PROTONIX) 40 MG tablet Take 40 mg by mouth daily as needed (heartburn).     . polyethylene glycol (MIRALAX / GLYCOLAX) packet Take 17 g by mouth daily as needed for mild constipation.     Marland Kitchen albuterol (PROVENTIL HFA;VENTOLIN HFA) 108 (90 Base) MCG/ACT inhaler Inhale into the  lungs every 6 (six) hours as needed for wheezing or shortness of breath.      No results found for this or any previous visit (from the past 48 hour(s)). No results found.  ROS  Blood pressure 107/63, pulse 71, temperature 97.6 F (36.4 C), temperature source Oral, resp. rate 17, height 5\' 5"  (1.651 m), weight 200 lb (90.7 kg), SpO2 99 %. Physical Exam  Constitutional: She appears well-developed and well-nourished.  HENT:  Mouth/Throat: Oropharynx is clear and moist.  Eyes: Conjunctivae are normal. No scleral icterus.  Neck: No thyromegaly present.  Cardiovascular: Normal rate, regular rhythm and normal heart sounds.   No murmur heard. Respiratory: Effort normal and breath sounds normal.  GI: Soft. She exhibits no distension and no mass. There is no tenderness.  Musculoskeletal: She exhibits no edema.  Lymphadenopathy:    She has no cervical adenopathy.  Neurological: She is alert.  Skin: Skin is warm and dry.     Assessment/Plan Solid food dysphagia. GERD. EGD with ED.  Hildred Laser, MD 03/22/2016, 2:16 PM

## 2016-03-22 NOTE — Discharge Instructions (Signed)
Esophagogastroduodenoscopy, Care After Introduction Refer to this sheet in the next few weeks. These instructions provide you with information about caring for yourself after your procedure. Your health care provider may also give you more specific instructions. Your treatment has been planned according to current medical practices, but problems sometimes occur. Call your health care provider if you have any problems or questions after your procedure. What can I expect after the procedure? After the procedure, it is common to have:  A sore throat.  Nausea.  Bloating.  Dizziness.  Fatigue. Follow these instructions at home:  Do not eat or drink anything until the numbing medicine (local anesthetic) has worn off and your gag reflex has returned. You will know that the local anesthetic has worn off when you can swallow comfortably.  Do not drive for 24 hours if you received a medicine to help you relax (sedative).  If your health care provider took a tissue sample for testing during the procedure, make sure to get your test results. This is your responsibility. Ask your health care provider or the department performing the test when your results will be ready.  Keep all follow-up visits as told by your health care provider. This is important. Contact a health care provider if:  You cannot stop coughing.  You are not urinating.  You are urinating less than usual. Get help right away if:  You have trouble swallowing.  You cannot eat or drink.  You have throat or chest pain that gets worse.  You are dizzy or light-headed.  You faint.  You have nausea or vomiting.  You have chills.  You have a fever.  You have severe abdominal pain.  You have black, tarry, or bloody stools. This information is not intended to replace advice given to you by your health care provider. Make sure you discuss any questions you have with your health care provider. Document Released: 01/24/2012  Document Revised: 07/15/2015 Document Reviewed: 12/31/2014  2017 Elsevier    Take pantoprazole 40 mg by mouth 30 minutes before breakfast and evening meal. Anti-reflux measures as discussed. Resume other medications and diet as before. No driving for 24 hours. Physician will call with biopsy results.

## 2016-03-22 NOTE — Op Note (Signed)
[facility] Patient Name: Sarah Avila Procedure Date: 03/22/2016 2:03 PM MRN: [id number] Date of Birth: Nov 23, 1960 Attending MD: Hildred Laser , MD CSN: [id number] Age: 56 Admit Type: Outpatient Procedure:                Upper GI endoscopy Indications:              Esophageal dysphagia, Gastro-esophageal reflux                            disease Providers:                Hildred Laser, MD, Lurline Del, RN, Isabella Stalling,                            Technician Referring MD:             Delphina Cahill, MD Medicines:                Cetacaine spray, Meperidine 50 mg IV, Midazolam 6                            mg IV Complications:            No immediate complications. Estimated Blood Loss:     Estimated blood loss was minimal. Procedure:                Pre-Anesthesia Assessment:                           - Prior to the procedure, a History and Physical                            was performed, and patient medications and                            allergies were reviewed. The patient's tolerance of                            previous anesthesia was also reviewed. The risks                            and benefits of the procedure and the sedation                            options and risks were discussed with the patient.                            All questions were answered, and informed consent                            was obtained. Prior Anticoagulants: The patient has                            taken no previous anticoagulant or antiplatelet                            agents. ASA Grade  Assessment: II - A patient with                            mild systemic disease. After reviewing the risks                            and benefits, the patient was deemed in                            satisfactory condition to undergo the procedure.                           After obtaining informed consent, the endoscope was                            passed under direct vision. Throughout the                            procedure, the patient's blood pressure, pulse, and                            oxygen saturations were monitored continuously. The                            [id number] scope was introduced through the                            mouth, and advanced to the second part of duodenum.                            The upper GI endoscopy was accomplished without                            difficulty. The patient tolerated the procedure                            well. Scope In: 2:25:46 PM Scope Out: 2:37:00 PM Total Procedure Duration: 0 hours 11 minutes 14 seconds  Findings:      Mucosal changes including ringed esophagus, small-caliber esophagus and       punctuate white spots were found in the mid esophagus. Biopsies were       taken with a cold forceps for histology.      LA Grade C (one or more mucosal breaks continuous between tops of 2 or       more mucosal folds, less than 75% circumference) esophagitis was found       27 cm from the incisors.      There were esophageal mucosal changes consistent with long-segment       Barrett's esophagus present in the distal esophagus. The maximum       longitudinal extent of these mucosal changes was 4 cm in length.      The Z-line was regular and was found 34 cm from the incisors. A TTS       dilator was passed through the scope. Dilation with a 15-16.5-18 mm  balloon dilator was performed to 15 mm and 16.5 mm. The dilation site       was examined and showed no change.      A 3 cm hiatal hernia was present.      The entire examined stomach was normal.      The duodenal bulb and second portion of the duodenum were normal. Impression:               - Esophageal mucosal changes suggestive of                            eosinophilic esophagitis. Biopsied.                           - LA Grade C reflux esophagitis.                           - Esophageal mucosal changes consistent with                             long-segment Barrett's esophagus.                           - Z-line regular at 34 cm from the incisors with                            non-critical narrowing. Dilated.                           - 3 cm hiatal hernia.                           - Normal stomach.                           - Normal duodenal bulb and second portion of the                            duodenum.                           Comment: Erosions extended to 27 cm from the                            incisors.                           Proximal margin of Barretts at 31 cm from the                            incisors. Barrett's length is 4 cm. Biopsy not                            taken because of inflammation.                           GE junction at 34 cm from incisors and hiatus at 37. Moderate  Sedation:      Moderate (conscious) sedation was administered by the endoscopy nurse       and supervised by the endoscopist. The following parameters were       monitored: oxygen saturation, heart rate, blood pressure, CO2       capnography and response to care. Total physician intraservice time was       16 minutes. Recommendation:           - Patient has a contact number available for                            emergencies. The signs and symptoms of potential                            delayed complications were discussed with the                            patient. Return to normal activities tomorrow.                            Written discharge instructions were provided to the                            patient.                           - Resume previous diet today.                           - Continue present medications.                           - Use Protonix (pantoprazole) 40 mg PO BID.                           - Await pathology results.                           - Repeat upper endoscopy in 4 months to evaluate                            the response to therapy and to biopsy Barretts                             mucosa. Procedure Code(s):        --- Professional ---                           [id number], Esophagogastroduodenoscopy, flexible,                            transoral; with transendoscopic balloon dilation of                            esophagus (less than 30 mm diameter)  [id number], Esophagogastroduodenoscopy, flexible,                            transoral; with biopsy, single or multiple                           99152, Moderate sedation services provided by the                            same physician or other qualified health care                            professional performing the diagnostic or                            therapeutic service that the sedation supports,                            requiring the presence of an independent trained                            observer to assist in the monitoring of the                            patient's level of consciousness and physiological                            status; initial 15 minutes of intraservice time,                            patient age 45 years or older Diagnosis Code(s):        --- Professional ---                           K22.8, Other specified diseases of esophagus                           K21.0, Gastro-esophageal reflux disease with                            esophagitis                           K44.9, Diaphragmatic hernia without obstruction or                            gangrene                           R13.14, Dysphagia, pharyngoesophageal phase CPT copyright 2016 American Medical Association. All rights reserved. The codes documented in this report are preliminary and upon coder review may  be revised to meet current compliance requirements. Hildred Laser, MD Hildred Laser, MD 03/22/2016 2:53:11 PM This report has been signed electronically. Number of Addenda: 0

## 2016-03-29 ENCOUNTER — Encounter (HOSPITAL_COMMUNITY): Payer: Self-pay | Admitting: Internal Medicine

## 2016-06-05 ENCOUNTER — Other Ambulatory Visit: Payer: Self-pay | Admitting: Obstetrics and Gynecology

## 2016-06-05 DIAGNOSIS — Z1231 Encounter for screening mammogram for malignant neoplasm of breast: Secondary | ICD-10-CM

## 2016-06-08 ENCOUNTER — Encounter: Payer: Self-pay | Admitting: General Surgery

## 2016-06-12 ENCOUNTER — Ambulatory Visit (HOSPITAL_COMMUNITY)
Admission: RE | Admit: 2016-06-12 | Discharge: 2016-06-12 | Disposition: A | Discharge status: Home / Self Care | Payer: Managed Care, Other (non HMO) | Source: Ambulatory Visit | Attending: Obstetrics and Gynecology | Admitting: Obstetrics and Gynecology

## 2016-06-12 ENCOUNTER — Encounter (HOSPITAL_COMMUNITY): Payer: Self-pay

## 2016-06-12 DIAGNOSIS — Z1231 Encounter for screening mammogram for malignant neoplasm of breast: Secondary | ICD-10-CM | POA: Diagnosis not present

## 2016-06-13 ENCOUNTER — Ambulatory Visit (HOSPITAL_BASED_OUTPATIENT_CLINIC_OR_DEPARTMENT_OTHER): Payer: Managed Care, Other (non HMO) | Admitting: General Surgery

## 2016-06-13 DIAGNOSIS — C44792 Other specified malignant neoplasm of skin of right lower limb, including hip: Secondary | ICD-10-CM

## 2016-06-13 NOTE — Progress Notes (Signed)
[facility] CONSULT NOTE  Patient Care Team: Celene Squibb, MD as PCP - General (Internal Medicine)  CHIEF COMPLAINTS/PURPOSE OF CONSULTATION: Right dorsal foot skin lesion.   HISTORY OF PRESENTING ILLNESS:  Sarah Avila 56 y.o. female is here because of recent diagnosis of right dorsal foot skin lesion that had been present for approximately 2 years. She states when she first noticed it in increased in size rapidly and then stabilized and has remained the same size for approximately 2 years. She denies any associated symptoms with this mass, and was seeing her dermatologist for a mole on her left leg and requested that the right dorsal foot skin lesion be excised. She denies any itching, bleeding or any adenopathy. She denies any constitutional symptoms. 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 She was referred here for further valuation treatment.  I reviewed her records extensively and collaborated the history with the patient.  SUMMARY OF ONCOLOGIC HISTORY:  No history exists.    MEDICAL HISTORY:  Past Medical History:  Diagnosis Date  . Anxiety   . Constipation 03/31/2015  . Cystocele   . Fibrocystic breast   . Human papilloma virus   . Hx of constipation   . Hyperlipidemia   . LLQ pain 03/31/2015  . Nicotine addiction 08/08/2012  . Psoriasis     SURGICAL HISTORY: Past Surgical History:  Procedure Laterality Date  . APPENDECTOMY    . BIOPSY  03/22/2016   Procedure: BIOPSY;  Surgeon: Rogene Houston, MD;  Location: [facility];  Service: Endoscopy;;  esophagus  . BREAST EXCISIONAL BIOPSY     negative  . CHOLECYSTECTOMY    . ENDOMETRIAL ABLATION    . ESOPHAGEAL DILATION N/A 03/22/2016   Procedure: ESOPHAGEAL DILATION;  Surgeon: Rogene Houston, MD;  Location: [facility];  Service: Endoscopy;  Laterality: N/A;  . ESOPHAGOGASTRODUODENOSCOPY N/A 03/22/2016   Procedure:  ESOPHAGOGASTRODUODENOSCOPY (EGD);  Surgeon: Rogene Houston, MD;  Location: [facility];  Service: Endoscopy;  Laterality: N/A;  1:45  . TUBAL LIGATION      SOCIAL HISTORY: Social History   Social History  . Marital status: Divorced    Spouse name: N/A  . Number of children: N/A  . Years of education: N/A   Occupational History  . Not on file.   Social History Main Topics  . Smoking status: Current Every Day Smoker    Packs/day: 0.00    Years: 20.00    Types: Cigarettes  . Smokeless tobacco: Never Used     Comment: 1 cigarettes /day.   . Alcohol use Yes     Comment: 1-2 mixed drinks a week  . Drug use: No  . Sexual activity: Yes    Birth control/ protection: Surgical     Comment: ablation and tubal   Other Topics Concern  . Not on file   Social History Narrative  . No narrative on file    FAMILY HISTORY: Family History  Problem Relation Age of Onset  . Hypertension Mother   . Osteoporosis Mother   . Cancer Father     prostate cancer    ALLERGIES:  has No Known Allergies.  MEDICATIONS:  Current Outpatient Prescriptions  Medication Sig Dispense Refill  . Calcium-Magnesium-Zinc 167-83-8 MG TABS Take 1 tablet by mouth daily.     . Cholecalciferol (VITAMIN D-3 PO) Take 5,000 Units by mouth daily.    . pantoprazole (PROTONIX) 40  MG tablet Take 1 tablet (40 mg total) by mouth 2 (two) times daily before a meal. 60 tablet 5   No current facility-administered medications for this visit.     REVIEW OF SYSTEMS:   Constitutional: Denies fevers, chills or abnormal night sweats Eyes: Denies blurriness of vision, double vision or watery eyes Ears, nose, mouth, throat, and face: Denies mucositis or sore throat Respiratory: Denies cough, dyspnea or wheezes Cardiovascular: Denies palpitation, chest discomfort or lower extremity swelling Gastrointestinal:  Denies nausea, heartburn or change in bowel habits Skin: Denies abnormal skin rashes Lymphatics: Denies new  lymphadenopathy or easy bruising Neurological:Denies numbness, tingling or new weaknesses Behavioral/Psych: Mood is stable, no new changes  Breast:Denies any palpable lumps or discharge All other systems were reviewed with the patient and are negative.  PHYSICAL EXAMINATION: ECOG PERFORMANCE STATUS: 0 - Asymptomatic  Vitals:   06/13/16 1504  BP: (!) 111/56  Pulse: 68  Resp: 20  Temp: 98 F (36.7 C)   Filed Weights   06/13/16 1504  Weight: 205 lb 3.2 oz (93.1 kg)   Physical examination: Performed with female chaperone in the room. GENERAL:alert, no distress and comfortable SKIN: skin color, texture, turgor are normal, no rashes.  She has a bandage in place in the left proximal anterior thigh where recent biopsy was performed and this was left undisturbed. There is a 1.2 x 1.2 cm biopsy open wound on the dorsal distal third right foot that is healing without abnormality. There is no residual lesion and no surrounding nodularity or satellitosis. EYES: normal, conjunctiva are pink and non-injected, sclera clear OROPHARYNX:no exudate, no erythema and lips, buccal mucosa, and tongue normal  NECK: supple, thyroid normal size, non-tender, without nodularity LYMPH:  no palpable lymphadenopathy in the cervical, axillary or inguinal LUNGS: clear to auscultation and percussion with normal breathing effort HEART: regular rate & rhythm and no murmurs and no lower extremity edema ABDOMEN:abdomen soft, non-tender and normal bowel sounds Musculoskeletal:no cyanosis of digits and no clubbing  PSYCH: alert & oriented x 3 with fluent speech NEURO: no focal motor/sensory deficits LABORATORY DATA:  I have reviewed the data as listed Lab Results  Component Value Date   WBC 5.6 08/08/2012   HGB 13.3 08/08/2012   HCT 38.1 08/08/2012   MCV 87.0 08/08/2012   PLT 176 08/08/2012   Lab Results  Component Value Date   NA 141 08/08/2012   K 4.5 08/08/2012   CL 104 08/08/2012   CO2 27 08/08/2012     Pathology: [facility]: 06/05/2016 Skin, right dorsal foot shave Atypical CD 34 Positive Spindle Cell Neoplasm, favor dermatofibrosarcoma protuberans.  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 At a long discussion with the patient and her friend who accompanies her. We reviewed the pathology report and the diagnosis and natural history including treatment strategies for dermatofibrosarcoma protuberans. We reviewed extensively that getting clear margins is frequently a challenge with these lesions and as such we would proceed with a wide local excision and a delayed surgical closure pending final pathologically clear margins. She understands that this will require staged surgical procedures and may require return to the operating room to achieve clear margins again waiting for final pathology with another return to the operating room. Due to the anatomic location I'm requesting that she meet with our plastic surgeon to discuss closure options and in the meantime we will present her case at our tumor multidisciplinary conference.  All questions were  answered. The patient knows to call the clinic with any problems, questions or concerns.    Frederich Cha, MD 3:18 PM

## 2016-06-14 NOTE — Addendum Note (Signed)
Addended byHall Busing on: 06/14/2016 07:43 AM   Modules accepted: Orders

## 2016-06-23 ENCOUNTER — Telehealth: Payer: Self-pay | Admitting: *Deleted

## 2016-06-23 NOTE — Telephone Encounter (Signed)
Per Sarah John NP I have called and scheduled an appt for Tuesday. Patient aware

## 2016-06-27 ENCOUNTER — Ambulatory Visit (HOSPITAL_BASED_OUTPATIENT_CLINIC_OR_DEPARTMENT_OTHER): Payer: Managed Care, Other (non HMO) | Admitting: General Surgery

## 2016-06-27 ENCOUNTER — Encounter: Payer: Self-pay | Admitting: General Surgery

## 2016-06-27 VITALS — BP 110/60 | HR 70 | Temp 98.2°F | Resp 16 | Ht 65.5 in | Wt 203.8 lb

## 2016-06-27 DIAGNOSIS — C44792 Other specified malignant neoplasm of skin of right lower limb, including hip: Secondary | ICD-10-CM | POA: Diagnosis not present

## 2016-06-27 NOTE — Progress Notes (Signed)
I had the opportunity to meet with Sarah Avila for preoperative planning of a right dorsal foot dermatofibrosarcoma protuberans. Since her last visit we have had an opportunity to present her case at our tumor multidisciplinary conference and for her to meet with Dr. Marla Roe to discuss reconstructive options. Dr. Marla Roe had planned for a acellular matrix to be applied, however there is a question whether this will be covered by insurance. As such, we will plan to move forward with a wide local excision leaving the wound open for a planned staged procedure pending final pathologically clear margins. We reviewed this plan at length with the patient and she understands there is a possibility for split-thickness skin graft and we reviewed at length how that is performed and that there would be required donor site. She understands that due to the nature of this tumor there is a high incidence of positive margins and she may require more than one trip back to the operating room to achieve clear margins. If we are able to achieve clear margins on the first resection she will follow-up with Dr. Marla Roe we will finalize closure and take her back to surgery. She understands that while we are awaiting final margins we will have her do wet-to-dry dressing changes. We reviewed how this would be performed and she is willing to try to do this herself until she has the wound tentatively closed. Risk and benefits reviewed at length including but not limited to permanent numbness of the tissues distal of the resection, recurrence, positive margins, need for more excision of margins. We also discussed that if we are unable to get clear margins without creating significant disability we can discuss the possibility of radiation therapy versus observation versus medications. All questions were answered and we will see the patient on the day of surgery.

## 2016-06-27 NOTE — Patient Instructions (Signed)
You will receive a phone call from the [facility] from the surgery RN to discuss instructions.  We will also call you with a date and time for your upcoming surgery.  Please call for any questions or concerns.

## 2016-06-28 ENCOUNTER — Encounter (HOSPITAL_BASED_OUTPATIENT_CLINIC_OR_DEPARTMENT_OTHER): Payer: Self-pay | Admitting: *Deleted

## 2016-06-28 NOTE — Progress Notes (Signed)
NPO AFTER MN W/ EXCEPTION CLEAR LIQUIDS UNTIL 0800 (NO CREAM/ MILK PRODUCTS).  ARRIVE AT 1230.  NEEDS HG. WILL TAKE PROTONIX AM DOS W/ SIPS OF WATER.

## 2016-07-04 ENCOUNTER — Ambulatory Visit (HOSPITAL_BASED_OUTPATIENT_CLINIC_OR_DEPARTMENT_OTHER): Payer: Managed Care, Other (non HMO) | Admitting: Anesthesiology

## 2016-07-04 ENCOUNTER — Ambulatory Visit (HOSPITAL_BASED_OUTPATIENT_CLINIC_OR_DEPARTMENT_OTHER)
Admission: RE | Admit: 2016-07-04 | Discharge: 2016-07-04 | Disposition: A | Discharge status: Home / Self Care | Payer: Managed Care, Other (non HMO) | Source: Ambulatory Visit | Attending: General Surgery | Admitting: General Surgery

## 2016-07-04 ENCOUNTER — Encounter (HOSPITAL_BASED_OUTPATIENT_CLINIC_OR_DEPARTMENT_OTHER)
Admission: RE | Disposition: A | Discharge status: Home / Self Care | Payer: Self-pay | Source: Ambulatory Visit | Attending: General Surgery

## 2016-07-04 ENCOUNTER — Encounter (HOSPITAL_BASED_OUTPATIENT_CLINIC_OR_DEPARTMENT_OTHER): Payer: Self-pay | Admitting: *Deleted

## 2016-07-04 DIAGNOSIS — K449 Diaphragmatic hernia without obstruction or gangrene: Secondary | ICD-10-CM | POA: Diagnosis not present

## 2016-07-04 DIAGNOSIS — K219 Gastro-esophageal reflux disease without esophagitis: Secondary | ICD-10-CM | POA: Insufficient documentation

## 2016-07-04 DIAGNOSIS — Z87891 Personal history of nicotine dependence: Secondary | ICD-10-CM | POA: Insufficient documentation

## 2016-07-04 DIAGNOSIS — C44792 Other specified malignant neoplasm of skin of right lower limb, including hip: Secondary | ICD-10-CM

## 2016-07-04 HISTORY — DX: Diaphragmatic hernia without obstruction or gangrene: K44.9

## 2016-07-04 HISTORY — DX: Personal history of other diseases of the digestive system: Z87.19

## 2016-07-04 HISTORY — DX: Gastro-esophageal reflux disease without esophagitis: K21.9

## 2016-07-04 HISTORY — PX: EXCISION LESION FOOT: SHX6596

## 2016-07-04 HISTORY — DX: Presence of spectacles and contact lenses: Z97.3

## 2016-07-04 HISTORY — DX: Other specified malignant neoplasm of skin, unspecified: C44.99

## 2016-07-04 HISTORY — DX: Personal history of other infectious and parasitic diseases: Z86.19

## 2016-07-04 LAB — POCT HEMOGLOBIN-HEMACUE: Hemoglobin: 13.9 g/dL (ref 12.0–15.0)

## 2016-07-04 SURGERY — EXCISION, LESION, FOOT
Anesthesia: General | Site: Foot | Laterality: Right

## 2016-07-04 MED ORDER — DEXAMETHASONE SODIUM PHOSPHATE 10 MG/ML IJ SOLN
INTRAMUSCULAR | Status: AC
  Filled 2016-07-04: qty 1

## 2016-07-04 MED ORDER — LIDOCAINE 2% (20 MG/ML) 5 ML SYRINGE
INTRAMUSCULAR | Status: DC | PRN
  Administered 2016-07-04: 50 mg via INTRAVENOUS

## 2016-07-04 MED ORDER — CEFAZOLIN SODIUM-DEXTROSE 2-4 GM/100ML-% IV SOLN
INTRAVENOUS | Status: AC
  Filled 2016-07-04: qty 100

## 2016-07-04 MED ORDER — PROPOFOL 10 MG/ML IV BOLUS
INTRAVENOUS | Status: AC
  Filled 2016-07-04: qty 20

## 2016-07-04 MED ORDER — FENTANYL CITRATE (PF) 100 MCG/2ML IJ SOLN
25.0000 ug | INTRAMUSCULAR | Status: DC | PRN
  Filled 2016-07-04: qty 1

## 2016-07-04 MED ORDER — ONDANSETRON HCL 4 MG/2ML IJ SOLN
INTRAMUSCULAR | Status: AC
  Filled 2016-07-04: qty 2

## 2016-07-04 MED ORDER — BUPIVACAINE-EPINEPHRINE 0.25% -1:200000 IJ SOLN
INTRAMUSCULAR | Status: DC | PRN
  Administered 2016-07-04: 8 mL

## 2016-07-04 MED ORDER — PROPOFOL 10 MG/ML IV BOLUS
INTRAVENOUS | Status: AC
  Filled 2016-07-04: qty 40

## 2016-07-04 MED ORDER — PROPOFOL 10 MG/ML IV BOLUS
INTRAVENOUS | Status: DC | PRN
  Administered 2016-07-04: 60 mg via INTRAVENOUS

## 2016-07-04 MED ORDER — LIDOCAINE 2% (20 MG/ML) 5 ML SYRINGE
INTRAMUSCULAR | Status: AC
  Filled 2016-07-04: qty 5

## 2016-07-04 MED ORDER — MIDAZOLAM HCL 2 MG/2ML IJ SOLN
INTRAMUSCULAR | Status: AC
  Filled 2016-07-04: qty 2

## 2016-07-04 MED ORDER — METOCLOPRAMIDE HCL 5 MG/ML IJ SOLN
10.0000 mg | Freq: Once | INTRAMUSCULAR | Status: DC | PRN
  Filled 2016-07-04: qty 2

## 2016-07-04 MED ORDER — CEFAZOLIN SODIUM-DEXTROSE 2-4 GM/100ML-% IV SOLN
2.0000 g | INTRAVENOUS | Status: AC
  Administered 2016-07-04: 2 g via INTRAVENOUS
  Filled 2016-07-04: qty 100

## 2016-07-04 MED ORDER — OXYCODONE-ACETAMINOPHEN 5-325 MG PO TABS: 1.0000 | ORAL_TABLET | Freq: Four times a day (QID) | ORAL | 0 refills | Status: DC | PRN

## 2016-07-04 MED ORDER — LACTATED RINGERS IV SOLN
INTRAVENOUS | Status: DC
  Administered 2016-07-04: 13:00:00 via INTRAVENOUS
  Filled 2016-07-04: qty 1000

## 2016-07-04 MED ORDER — MIDAZOLAM HCL 5 MG/5ML IJ SOLN
INTRAMUSCULAR | Status: DC | PRN
  Administered 2016-07-04: 2 mg via INTRAVENOUS

## 2016-07-04 MED ORDER — FENTANYL CITRATE (PF) 250 MCG/5ML IJ SOLN
INTRAMUSCULAR | Status: DC | PRN
  Administered 2016-07-04: 50 ug via INTRAVENOUS

## 2016-07-04 MED ORDER — ONDANSETRON HCL 4 MG/2ML IJ SOLN
INTRAMUSCULAR | Status: DC | PRN
  Administered 2016-07-04: 4 mg via INTRAVENOUS

## 2016-07-04 MED ORDER — MEPERIDINE HCL 25 MG/ML IJ SOLN
6.2500 mg | INTRAMUSCULAR | Status: DC | PRN
  Filled 2016-07-04: qty 1

## 2016-07-04 MED ORDER — FENTANYL CITRATE (PF) 100 MCG/2ML IJ SOLN
INTRAMUSCULAR | Status: AC
  Filled 2016-07-04: qty 2

## 2016-07-04 MED ORDER — HYDROCODONE-ACETAMINOPHEN 7.5-325 MG PO TABS
1.0000 | ORAL_TABLET | Freq: Once | ORAL | Status: DC | PRN
  Filled 2016-07-04: qty 1

## 2016-07-04 SURGICAL SUPPLY — 66 items
ADH SKN CLS APL DERMABOND .7 (GAUZE/BANDAGES/DRESSINGS)
APL SKNCLS STERI-STRIP NONHPOA (GAUZE/BANDAGES/DRESSINGS)
APPLIER CLIP 13 LRG OPEN (CLIP)
APR CLP LRG 13 20 CLIP (CLIP)
BANDAGE CO FLEX L/F 2IN X 5YD (GAUZE/BANDAGES/DRESSINGS) ×1 IMPLANT
BENZOIN TINCTURE PRP APPL 2/3 (GAUZE/BANDAGES/DRESSINGS) IMPLANT
BLADE SURG 15 STRL LF DISP TIS (BLADE) ×1 IMPLANT
BLADE SURG 15 STRL SS (BLADE) ×2
BNDG CONFORM 2 STRL LF (GAUZE/BANDAGES/DRESSINGS) ×1 IMPLANT
CANISTER SUCT 3000ML PPV (MISCELLANEOUS) ×2 IMPLANT
CATH ROBINSON RED A/P 10FR (CATHETERS) IMPLANT
CHLORAPREP W/TINT 26ML (MISCELLANEOUS) ×2 IMPLANT
CLIP APPLIE 13 LRG OPEN (CLIP) IMPLANT
CLIP TI MEDIUM 6 (CLIP) ×1 IMPLANT
CLIP TI WIDE RED SMALL 6 (CLIP) ×1 IMPLANT
COVER BACK TABLE 60X90IN (DRAPES) ×2 IMPLANT
COVER MAYO STAND STRL (DRAPES) ×2 IMPLANT
DERMABOND ADVANCED (GAUZE/BANDAGES/DRESSINGS)
DERMABOND ADVANCED .7 DNX12 (GAUZE/BANDAGES/DRESSINGS) IMPLANT
DRAIN CHANNEL 19F RND (DRAIN) IMPLANT
DRAIN JACKSON PRT FLT 10 (DRAIN) IMPLANT
DRAPE LAPAROTOMY 100X72 PEDS (DRAPES) ×2 IMPLANT
DRAPE LAPAROTOMY T 102X78X121 (DRAPES) IMPLANT
DRAPE ORTHO SPLIT 87X125 STRL (DRAPES) IMPLANT
DRAPE SPLIT 77X100IN (DRAPES) IMPLANT
DRAPE U-SHAPE 47X51 STRL (DRAPES) IMPLANT
DRAPE UTILITY XL STRL (DRAPES) ×1 IMPLANT
DRSG TEGADERM 4X4.75 (GAUZE/BANDAGES/DRESSINGS) IMPLANT
ELECT COATED BLADE 2.86 ST (ELECTRODE) ×2 IMPLANT
ELECT REM PT RETURN 9FT ADLT (ELECTROSURGICAL) ×2
ELECTRODE REM PT RTRN 9FT ADLT (ELECTROSURGICAL) ×1 IMPLANT
EVACUATOR SILICONE 100CC (DRAIN) IMPLANT
GAUZE SPONGE 4X4 16PLY XRAY LF (GAUZE/BANDAGES/DRESSINGS) ×2 IMPLANT
GAUZE SPONGE 4X4 8PLY STR LF (GAUZE/BANDAGES/DRESSINGS) ×1 IMPLANT
GLOVE BIO SURGEON STRL SZ7.5 (GLOVE) ×2 IMPLANT
GLOVE BIOGEL PI IND STRL 8 (GLOVE) ×1 IMPLANT
GLOVE BIOGEL PI INDICATOR 8 (GLOVE) ×1
LIGASURE 7.4 SM JAW OPEN (ELECTROSURGICAL) ×1 IMPLANT
LOOP VESSEL MAXI BLUE (MISCELLANEOUS) ×2 IMPLANT
LOOP VESSEL MINI RED (MISCELLANEOUS) ×2 IMPLANT
NDL HYPO 25X1 1.5 SAFETY (NEEDLE) ×1 IMPLANT
NEEDLE HYPO 25X1 1.5 SAFETY (NEEDLE) ×2 IMPLANT
PACK BASIN DAY SURGERY FS (CUSTOM PROCEDURE TRAY) ×2 IMPLANT
PENCIL BUTTON HOLSTER BLD 10FT (ELECTRODE) ×2 IMPLANT
SPONGE GAUZE 4X4 12PLY STER LF (GAUZE/BANDAGES/DRESSINGS) IMPLANT
SPONGE LAP 18X18 X RAY DECT (DISPOSABLE) IMPLANT
SPONGE LAP 4X18 X RAY DECT (DISPOSABLE) ×1 IMPLANT
STOCKINETTE 6  STRL (DRAPES)
STOCKINETTE 6 STRL (DRAPES) IMPLANT
STRIP CLOSURE SKIN 1/2X4 (GAUZE/BANDAGES/DRESSINGS) IMPLANT
SUCTION FRAZIER TIP 10 FR DISP (SUCTIONS) ×1 IMPLANT
SUT ETHILON 2 0 PS N (SUTURE) IMPLANT
SUT MNCRL AB 3-0 PS2 18 (SUTURE) ×1 IMPLANT
SUT SILK 2 0 (SUTURE) ×2
SUT SILK 2-0 18XBRD TIE 12 (SUTURE) IMPLANT
SUT VIC AB 2-0 SH 27 (SUTURE)
SUT VIC AB 2-0 SH 27XBRD (SUTURE) ×1 IMPLANT
SUT VIC AB 3-0 SH 18 (SUTURE) IMPLANT
SUT VIC AB 3-0 SH 27 (SUTURE)
SUT VIC AB 3-0 SH 27X BRD (SUTURE) ×1 IMPLANT
SYR BULB 3OZ (MISCELLANEOUS) ×2 IMPLANT
SYR CONTROL 10ML LL (SYRINGE) ×2 IMPLANT
TOWEL OR 17X24 6PK STRL BLUE (TOWEL DISPOSABLE) ×4 IMPLANT
TUBE CONNECTING 12X1/4 (SUCTIONS) ×2 IMPLANT
VESSEL LOOPS MINI RED (MISCELLANEOUS)
YANKAUER SUCT BULB TIP NO VENT (SUCTIONS) IMPLANT

## 2016-07-04 NOTE — Brief Op Note (Signed)
07/04/2016  1:46 PM  PATIENT:  Sarah Avila  56 y.o. female  PRE-OPERATIVE DIAGNOSIS:  dermatofibro sarcoma protuberans  POST-OPERATIVE DIAGNOSIS: Dermatofibrosarcoma protuberans  PROCEDURE:  Procedure(s): WIDE LOCAL EXCISION OF RIGHT DORSAL FOOT (Right)  3.5 cm X 4.0 cm  SURGEON:  T. Tonye Becket, MD   ANESTHESIA:   8 ml marcaine  IVF: 739ml  EBL: min  SPECIMEN: Right distal dorsal foot skin  DISPOSITION OF SPECIMEN:  Fresh to pathology for permanent   COUNTS:  Correct x 2  PATIENT DISPOSITION:  Stable to PACU

## 2016-07-04 NOTE — Anesthesia Procedure Notes (Signed)
Procedure Name: MAC Date/Time: 07/04/2016 1:15 PM Performed by: Bethena Roys T Pre-anesthesia Checklist: Patient identified, Timeout performed, Emergency Drugs available, Suction available and Patient being monitored Patient Re-evaluated:Patient Re-evaluated prior to inductionOxygen Delivery Method: Nasal cannula Placement Confirmation: positive ETCO2

## 2016-07-04 NOTE — Discharge Instructions (Signed)
Excision of Skin Lesions, Care After Refer to this sheet in the next few weeks. These instructions provide you with information about caring for yourself after your procedure. Your health care provider may also give you more specific instructions. Your treatment has been planned according to current medical practices, but problems sometimes occur. Call your health care provider if you have any problems or questions after your procedure. What can I expect after the procedure? After your procedure, it is common to have pain or discomfort at the excision site. Follow these instructions at home:  Take over-the-counter and prescription medicines only as told by your health care provider.  Follow instructions from your health care provider about:  How to take care of your excision site. You should keep the site clean, dry, and protected for at least 48 hours.  When and how you should change your bandage (dressing).  When you should remove your dressing.  Removing whatever was used to close your excision site.  Check the excision area every day for signs of infection. Watch for:  Redness, swelling, or pain.  Fluid, blood, or pus.  For bleeding, apply gentle but firm pressure to the area using a folded towel for 20 minutes.  Avoid high-impact exercise and activities until the stitches (sutures) are removed or the area heals.  Follow instructions from your health care provider about how to minimize scarring. Avoid sun exposure until the area has healed. Scarring should lessen over time.  Keep all follow-up visits as told by your health care provider. This is important. Contact a health care provider if:  You have a fever.  You have redness, swelling, or pain at the excision site.  You have fluid, blood, or pus coming from the excision site.  You have ongoing bleeding at the excision site.  You have pain that does not improve in 2-3 days after your procedure.  You notice skin  irregularities or changes in sensation. This information is not intended to replace advice given to you by your health care provider. Make sure you discuss any questions you have with your health care provider. Document Released: 06/23/2014 Document Revised: 07/15/2015 Document Reviewed: 03/25/2014 Elsevier Interactive Patient Education  2017 [facility] Care Instructions  Activity: Get plenty of rest for the remainder of the day. A responsible individual must stay with you for 24 hours following the procedure.  For the next 24 hours, DO NOT: -Drive a car -Paediatric nurse -Drink alcoholic beverages -Take any medication unless instructed by your physician -Make any legal decisions or sign important papers.  Meals: Start with liquid foods such as gelatin or soup. Progress to regular foods as tolerated. Avoid greasy, spicy, heavy foods. If nausea and/or vomiting occur, drink only clear liquids until the nausea and/or vomiting subsides. Call your physician if vomiting continues.  Special Instructions/Symptoms: Your throat may feel dry or sore from the anesthesia or the breathing tube placed in your throat during surgery. If this causes discomfort, gargle with warm salt water. The discomfort should disappear within 24 hours.  If you had a scopolamine patch placed behind your ear for the management of post- operative nausea and/or vomiting:  1. The medication in the patch is effective for 72 hours, after which it should be removed.  Wrap patch in a tissue and discard in the trash. Wash hands thoroughly with soap and water. 2. You may remove the patch earlier than 72 hours if you experience unpleasant side effects which may include  dry mouth, dizziness or visual disturbances. 3. Avoid touching the patch. Wash your hands with soap and water after contact with the patch.

## 2016-07-04 NOTE — H&P (Signed)
The patient presents today for resection of the right dorsal foot dermatofibrosarcoma protuberans with planned delayed closure pending final pathologic margins. She has no significant changes in her past medical history since her last visit. She has no new complaints. Focused physical examination of the right dorsum foot reveals a healing biopsy site which was marked and verified with the patient in the preop area. Lungs clear to auscultation, equal breath sounds Cardiovascular system regular rate and rhythm Impression: Patient with right dorsal foot dermatofibrosarcoma protuberans Plan: Wide local excision with planned delayed closure. Risk and benefits reviewed at length with the patient and verified her understanding that this wound will be left open and she may require multiple re-excisions to clear margins.

## 2016-07-04 NOTE — Transfer of Care (Signed)
Immediate Anesthesia Transfer of Care Note  Patient: Sarah Avila  Procedure(s) Performed: Procedure(s): WIDE LOCAL EXCISION OF RIGHT DORSAL FOOT (Right)  Patient Location: PACU  Anesthesia Type:MAC  Level of Consciousness: awake, alert  and oriented  Airway & Oxygen Therapy: Patient Spontanous Breathing  Post-op Assessment: Report given to RN  Post vital signs: Reviewed and stable  Last Vitals: 98/54, 72, 16, 98% Vitals:   07/04/16 1207  BP: (!) 106/54  Pulse: 62  Resp: 17  Temp: 36.4 C    Last Pain:  Vitals:   07/04/16 1207  TempSrc: Oral      Patients Stated Pain Goal: 5 (58/30/94 0768)  Complications: No apparent anesthesia complications

## 2016-07-04 NOTE — Op Note (Signed)
   Pre-operative Diagnosis: Right Dorsal Foot Dermatofibrosarcoma protuberans  Procedure: Wide local excision right dorsal foot (3.4cm x 4.0cm)   Post-operative Diagnosis: Right Dorsal Foot Dermatofibrosarcoma protuberans  Surgeon: Malachi Bonds  Anesthesia:  Local Marcaine (22ml) with sedation  IVF: 700 ml   EBL: min  Complications: None; patient tolerated the procedure well.   Disposition: PACU - hemodynamically stable.   Condition: stable   Indication for Procedure: Patient with a biopsy of the right distal dorsal foot consistent with dermatofibrosarcoma protuberans, now for planned wide local excision with delayed closure pending final pathologic margins.  Procedure in Detail: After informed consent was obtained patient was identified taken to the operating room and placed in the supine position. After adequate IV sedation was achieved the patient was prepped and draped in standard sterile fashion. Attention was directed to the previously identified right dorsal foot biopsy site, which had been marked and verified with the patient preoperatively. Timeout was performed and the area was infiltrated using local Marcaine. After adequate local anesthetic the area was measured with a 1 cm margin from the widest area of the biopsy site circumferentially. This was then incised sharply and an elevated off of the underlying tissues. Hemostasis was ensured using electrocautery. The specimen was oriented with marking sutures. It was then placed on a cork board to maintain orientation and a diagram was created. This was sent fresh for permanent. The wound was verified to be hemostatic irrigated with copious amounts of irrigation. Wet-to-dry dressing was placed and the area was wrapped with gauze and Coban. At the end of the case sponge needle counts were reported as correct 2. I was present scrubbed the entire procedure.

## 2016-07-04 NOTE — Anesthesia Postprocedure Evaluation (Addendum)
Anesthesia Post Note  Patient: Sarah Avila  Procedure(s) Performed: Procedure(s) (LRB): WIDE LOCAL EXCISION OF RIGHT DORSAL FOOT (Right)  Patient location during evaluation: PACU Anesthesia Type: General Level of consciousness: awake and alert Pain management: pain level controlled Vital Signs Assessment: post-procedure vital signs reviewed and stable Respiratory status: spontaneous breathing and respiratory function stable Cardiovascular status: stable Anesthetic complications: no       Last Vitals:  Vitals:   07/04/16 1345 07/04/16 1400  BP: (!) 98/54 (!) 91/55  Pulse: 72 (!) 57  Resp: 16 14  Temp: 36.4 C     Last Pain:  Vitals:   07/04/16 1207  TempSrc: Oral                 Deondre Marinaro DANIEL

## 2016-07-04 NOTE — Anesthesia Preprocedure Evaluation (Addendum)
Anesthesia Evaluation  Patient identified by MRN, date of birth, ID band Patient awake    Reviewed: Allergy & Precautions, NPO status , Patient's Chart, lab work & pertinent test results  Airway Mallampati: III  TM Distance: >3 FB Neck ROM: Full    Dental no notable dental hx. (+) Teeth Intact   Pulmonary former smoker,  20 pack years- quit last week   Pulmonary exam normal breath sounds clear to auscultation       Cardiovascular negative cardio ROS Normal cardiovascular exam Rhythm:Regular Rate:Normal     Neuro/Psych negative neurological ROS  negative psych ROS   GI/Hepatic Neg liver ROS, hiatal hernia, GERD  Medicated and Controlled,  Endo/Other  negative endocrine ROS  Renal/GU negative Renal ROS  negative genitourinary   Musculoskeletal Dermatofibrosarcoma right foot   Abdominal   Peds  Hematology negative hematology ROS (+)   Anesthesia Other Findings   Reproductive/Obstetrics                             Anesthesia Physical Anesthesia Plan  ASA: II  Anesthesia Plan: MAC   Post-op Pain Management:    Induction: Intravenous  Airway Management Planned: Natural Airway and Nasal Cannula  Additional Equipment:   Intra-op Plan:   Post-operative Plan:   Informed Consent: I have reviewed the patients History and Physical, chart, labs and discussed the procedure including the risks, benefits and alternatives for the proposed anesthesia with the patient or authorized representative who has indicated his/her understanding and acceptance.     Plan Discussed with: CRNA, Anesthesiologist and Surgeon  Anesthesia Plan Comments:        Anesthesia Quick Evaluation

## 2016-07-05 ENCOUNTER — Telehealth: Payer: Self-pay | Admitting: Gynecologic Oncology

## 2016-07-05 ENCOUNTER — Encounter (HOSPITAL_BASED_OUTPATIENT_CLINIC_OR_DEPARTMENT_OTHER): Payer: Self-pay | Admitting: General Surgery

## 2016-07-05 NOTE — Telephone Encounter (Signed)
Called to check on patient' status.  Patient stating her pain is better controlled with taking two percocets at a time.  Her husband was able to change the dressing with no issues or concerns.  Advised to call for any needs or concerns and to let us know if she needed a refill on her pain medication.

## 2016-07-05 NOTE — Telephone Encounter (Signed)
Returned call to patient.  Patient had called stating her pain is only controlled for one hour taking one percocet.  Spoke with the patient's husband.  He states there is some swelling around the toes and foot but they are keeping it elevated.  Advised the patient to take two percocet at once every four hours as needed for severe pain and before the dressing change and to call our office with an update this afternoon.  Dr. Rolanda Jay made aware of the above.  No concerns voiced from the patient.  Advised to call for any needs or concerns.

## 2016-07-06 ENCOUNTER — Telehealth: Payer: Self-pay | Admitting: Gynecologic Oncology

## 2016-07-06 NOTE — Telephone Encounter (Signed)
Called to check on patient's status.  Changing the dressing now.  Informed of final pathology with clear margins.  They state they are scheduled to see Dr. Marla Roe tomorrow.  Advised to call for any needs or concerns.  No concerns voiced at this time.

## 2016-07-07 ENCOUNTER — Other Ambulatory Visit: Payer: Self-pay | Admitting: Gynecologic Oncology

## 2016-07-07 DIAGNOSIS — G8918 Other acute postprocedural pain: Secondary | ICD-10-CM

## 2016-07-07 MED ORDER — OXYCODONE-ACETAMINOPHEN 5-325 MG PO TABS: 1.0000 | ORAL_TABLET | Freq: Four times a day (QID) | ORAL | 0 refills | Status: DC | PRN

## 2016-07-13 ENCOUNTER — Encounter (HOSPITAL_BASED_OUTPATIENT_CLINIC_OR_DEPARTMENT_OTHER): Payer: Self-pay | Admitting: *Deleted

## 2016-07-14 ENCOUNTER — Ambulatory Visit: Payer: Self-pay | Admitting: Plastic Surgery

## 2016-07-20 ENCOUNTER — Ambulatory Visit (HOSPITAL_BASED_OUTPATIENT_CLINIC_OR_DEPARTMENT_OTHER)
Admission: RE | Admit: 2016-07-20 | Payer: Managed Care, Other (non HMO) | Source: Ambulatory Visit | Admitting: Plastic Surgery

## 2016-07-20 SURGERY — APPLICATION, GRAFT, SKIN, SPLIT-THICKNESS
Anesthesia: General | Laterality: Right

## 2016-07-21 ENCOUNTER — Encounter (INDEPENDENT_AMBULATORY_CARE_PROVIDER_SITE_OTHER): Payer: Self-pay | Admitting: *Deleted

## 2016-07-22 NOTE — Addendum Note (Signed)
Addendum  created 07/22/16 1005 by Duane Boston, MD   Sign clinical note

## 2016-08-03 ENCOUNTER — Telehealth (INDEPENDENT_AMBULATORY_CARE_PROVIDER_SITE_OTHER): Payer: Self-pay | Admitting: *Deleted

## 2016-08-03 ENCOUNTER — Other Ambulatory Visit (INDEPENDENT_AMBULATORY_CARE_PROVIDER_SITE_OTHER): Payer: Self-pay | Admitting: *Deleted

## 2016-08-03 ENCOUNTER — Encounter (INDEPENDENT_AMBULATORY_CARE_PROVIDER_SITE_OTHER): Payer: Self-pay | Admitting: *Deleted

## 2016-08-03 DIAGNOSIS — K2270 Barrett's esophagus without dysplasia: Secondary | ICD-10-CM

## 2016-08-03 NOTE — Telephone Encounter (Signed)
Referring MD/PCP: hall   Procedure: egd w biopsy  Reason/Indication:  Barrett's esophagus  Has patient had this procedure before?  Yes, 2018  If so, when, by whom and where?    Is there a family history of colon cancer?    Who?  What age when diagnosed?    Is patient diabetic?   no      Does patient have prosthetic heart valve or mechanical valve?  no  Do you have a pacemaker?  no  Has patient ever had endocarditis? no  Has patient had joint replacement within last 12 months?  no  Does patient tend to be constipated or take laxatives? no  Does patient have a history of alcohol/drug use?  no  Is patient on Coumadin, Plavix and/or Aspirin? no  Medications: protonix  Allergies: nkda  Medication Adjustment per Dr Laural Golden:   Procedure date & time: 09/01/16 at 1230

## 2016-08-07 NOTE — Telephone Encounter (Signed)
agree

## 2016-08-16 ENCOUNTER — Encounter (INDEPENDENT_AMBULATORY_CARE_PROVIDER_SITE_OTHER): Payer: Self-pay | Admitting: *Deleted

## 2016-09-01 ENCOUNTER — Encounter (HOSPITAL_COMMUNITY): Payer: Self-pay

## 2016-09-01 ENCOUNTER — Encounter (HOSPITAL_COMMUNITY)
Admission: RE | Disposition: A | Discharge status: Home / Self Care | Payer: Self-pay | Source: Ambulatory Visit | Attending: Internal Medicine

## 2016-09-01 ENCOUNTER — Ambulatory Visit (HOSPITAL_COMMUNITY)
Admission: RE | Admit: 2016-09-01 | Discharge: 2016-09-01 | Disposition: A | Discharge status: Home / Self Care | Payer: Managed Care, Other (non HMO) | Source: Ambulatory Visit | Attending: Internal Medicine | Admitting: Internal Medicine

## 2016-09-01 DIAGNOSIS — K210 Gastro-esophageal reflux disease with esophagitis: Secondary | ICD-10-CM | POA: Diagnosis not present

## 2016-09-01 DIAGNOSIS — K3189 Other diseases of stomach and duodenum: Secondary | ICD-10-CM | POA: Diagnosis not present

## 2016-09-01 DIAGNOSIS — K2270 Barrett's esophagus without dysplasia: Secondary | ICD-10-CM | POA: Diagnosis not present

## 2016-09-01 DIAGNOSIS — Z79899 Other long term (current) drug therapy: Secondary | ICD-10-CM | POA: Diagnosis not present

## 2016-09-01 DIAGNOSIS — K449 Diaphragmatic hernia without obstruction or gangrene: Secondary | ICD-10-CM | POA: Insufficient documentation

## 2016-09-01 DIAGNOSIS — Z87891 Personal history of nicotine dependence: Secondary | ICD-10-CM | POA: Insufficient documentation

## 2016-09-01 DIAGNOSIS — K2960 Other gastritis without bleeding: Secondary | ICD-10-CM

## 2016-09-01 DIAGNOSIS — L409 Psoriasis, unspecified: Secondary | ICD-10-CM | POA: Insufficient documentation

## 2016-09-01 DIAGNOSIS — K228 Other specified diseases of esophagus: Secondary | ICD-10-CM | POA: Insufficient documentation

## 2016-09-01 HISTORY — PX: BIOPSY: SHX5522

## 2016-09-01 HISTORY — PX: ESOPHAGOGASTRODUODENOSCOPY: SHX5428

## 2016-09-01 SURGERY — EGD (ESOPHAGOGASTRODUODENOSCOPY)
Anesthesia: Moderate Sedation

## 2016-09-01 MED ORDER — MIDAZOLAM HCL 5 MG/5ML IJ SOLN
INTRAMUSCULAR | Status: DC | PRN
  Administered 2016-09-01: 1 mg via INTRAVENOUS
  Administered 2016-09-01 (×3): 2 mg via INTRAVENOUS

## 2016-09-01 MED ORDER — LIDOCAINE VISCOUS 2 % MT SOLN
OROMUCOSAL | Status: DC | PRN
  Administered 2016-09-01: 4 mL via OROMUCOSAL

## 2016-09-01 MED ORDER — MIDAZOLAM HCL 5 MG/5ML IJ SOLN
INTRAMUSCULAR | Status: AC
  Filled 2016-09-01: qty 10

## 2016-09-01 MED ORDER — STERILE WATER FOR IRRIGATION IR SOLN
Status: DC | PRN
  Administered 2016-09-01: 15:00:00

## 2016-09-01 MED ORDER — LIDOCAINE VISCOUS 2 % MT SOLN
OROMUCOSAL | Status: AC
  Filled 2016-09-01: qty 15

## 2016-09-01 MED ORDER — MEPERIDINE HCL 50 MG/ML IJ SOLN
INTRAMUSCULAR | Status: DC | PRN
Start: 2016-09-01 — End: 2016-09-01
  Administered 2016-09-01 (×2): 25 mg via INTRAVENOUS

## 2016-09-01 MED ORDER — SODIUM CHLORIDE 0.9 % IV SOLN
INTRAVENOUS | Status: DC
  Administered 2016-09-01: 14:00:00 via INTRAVENOUS

## 2016-09-01 MED ORDER — MEPERIDINE HCL 50 MG/ML IJ SOLN
INTRAMUSCULAR | Status: AC
  Filled 2016-09-01: qty 1

## 2016-09-01 NOTE — Discharge Instructions (Signed)
°  Esophagogastroduodenoscopy, Care After Refer to this sheet in the next few weeks. These instructions provide you with information about caring for yourself after your procedure. Your health care provider may also give you more specific instructions. Your treatment has been planned according to current medical practices, but problems sometimes occur. Call your health care provider if you have any problems or questions after your procedure. Dr Laural Golden [phone] (after hours and weekends, call the hospital and have the GI doctor on call to be paged) What can I expect after the procedure? After the procedure, it is common to have:  A sore throat.  Nausea.  Bloating.  Dizziness.  Fatigue.  Follow these instructions at home:  Do not eat or drink anything until the numbing medicine (local anesthetic) has worn off and your gag reflex has returned. You will know that the local anesthetic has worn off when you can swallow comfortably.  Do not drive for 24 hours if you received a medicine to help you relax (sedative).  If your health care provider took a tissue sample for testing during the procedure, make sure to get your test results. This is your responsibility. Ask your health care provider or the department performing the test when your results will be ready.  Keep all follow-up visits as told by your health care provider. This is important. Contact a health care provider if:  You cannot stop coughing.  You are not urinating.  You are urinating less than usual. Get help right away if:  You have trouble swallowing.  You cannot eat or drink.  You have throat or chest pain that gets worse.  You are dizzy or light-headed.  You faint.  You have nausea or vomiting.  You have chills.  You have a fever.  You have severe abdominal pain.  You have black, tarry, or bloody stool  No driving for 24 hours. Physician will call with biopsy results.

## 2016-09-01 NOTE — Op Note (Signed)
[facility] Patient Name: Sarah Avila Procedure Date: 09/01/2016 2:35 PM MRN: [id number] Date of Birth: 01-19-1961 Attending MD: Hildred Laser , MD CSN: [id number] Age: 56 Admit Type: Outpatient Procedure:                Upper GI endoscopy Indications:              Suspected Barrett's esophagus Providers:                Hildred Laser, MD, Otis Peak B. Sharon Seller, RN, Rosina Lowenstein, RN Referring MD:             Delphina Cahill, MD Medicines:                Lidocaine spray, Meperidine 50 mg IV, Midazolam 7                            mg IV Complications:            No immediate complications. Estimated Blood Loss:     Estimated blood loss was minimal. Procedure:                Pre-Anesthesia Assessment:                           - Prior to the procedure, a History and Physical                            was performed, and patient medications and                            allergies were reviewed. The patient's tolerance of                            previous anesthesia was also reviewed. The risks                            and benefits of the procedure and the sedation                            options and risks were discussed with the patient.                            All questions were answered, and informed consent                            was obtained. Prior Anticoagulants: The patient has                            taken no previous anticoagulant or antiplatelet                            agents. ASA Grade Assessment: II - A patient with  mild systemic disease. After reviewing the risks                            and benefits, the patient was deemed in                            satisfactory condition to undergo the procedure.                           After obtaining informed consent, the endoscope was                            passed under direct vision. Throughout the                            procedure, the patient's  blood pressure, pulse, and                            oxygen saturations were monitored continuously. The                            [id number] ([id number]) scope was introduced through the                            mouth, and advanced to the second part of duodenum.                            The upper GI endoscopy was accomplished without                            difficulty. The patient tolerated the procedure                            well. Scope In: 2:53:06 PM Scope Out: 3:06:17 PM Total Procedure Duration: 0 hours 13 minutes 11 seconds  Findings:      Diffuse mild mucosal changes characterized by discoloration and       nodularity were found in the proximal esophagus and in the mid       esophagus. Biopsies were taken with a cold forceps for histology.      There were esophageal mucosal changes consistent with long-segment       Barrett's esophagus present in the distal esophagus. The maximum       longitudinal extent of these mucosal changes was 4 cm in length. Mucosa       was biopsied with a cold forceps for histology. A total of 2 specimen       bottles were sent to pathology.      The Z-line was regular and was found 33 cm from the incisors.      A 3 cm hiatal hernia was present.      A few erosions were found in the gastric antrum.      The exam of the stomach was otherwise normal.      The duodenal bulb and second portion of the duodenum were normal. Impression:               -  Healed erosive esophagitis                           - Discolored, nodular mucosa in the esophagus.                            Biopsied.                           - Esophageal mucosal changes consistent with                            long-segment Barrett's esophagus. Biopsied from                            distal and proximal section.                           - Z-line regular, 33 cm from the incisors.                           - 3 cm hiatal hernia.                           - Erosive  gastropathy.new since last EGD of 6                            months ago.                           - Normal duodenal bulb and second portion of the                            duodenum. Moderate Sedation:      Moderate (conscious) sedation was administered by the endoscopy nurse       and supervised by the endoscopist. The following parameters were       monitored: oxygen saturation, heart rate, blood pressure, CO2       capnography and response to care. Total physician intraservice time was       20 minutes. Recommendation:           - Patient has a contact number available for                            emergencies. The signs and symptoms of potential                            delayed complications were discussed with the                            patient. Return to normal activities tomorrow.                            Written discharge instructions were provided to the  patient.                           - Resume previous diet today.                           - Continue present medications.                           - Await pathology results.                           - H. pylori serology.                           - Repeat upper endoscopy for surveillance based on                            pathology results. Procedure Code(s):        --- Professional ---                           [phone], Esophagogastroduodenoscopy, flexible,                            transoral; with biopsy, single or multiple                           99152, Moderate sedation services provided by the                            same physician or other qualified health care                            professional performing the diagnostic or                            therapeutic service that the sedation supports,                            requiring the presence of an independent trained                            observer to assist in the monitoring of the                             patient's level of consciousness and physiological                            status; initial 15 minutes of intraservice time,                            patient age 33 years or older Diagnosis Code(s):        --- Professional ---                           K22.8,  Other specified diseases of esophagus                           K44.9, Diaphragmatic hernia without obstruction or                            gangrene                           K31.89, Other diseases of stomach and duodenum CPT copyright 2016 [employer]. All rights reserved. The codes documented in this report are preliminary and upon coder review may  be revised to meet current compliance requirements. Hildred Laser, MD Hildred Laser, MD 09/01/2016 3:19:36 PM This report has been signed electronically. Number of Addenda: 0

## 2016-09-01 NOTE — H&P (Signed)
Sarah Avila is an 56 y.o. female.   Chief Complaint: Patient is here for EGD. HPI: Patient is 42 Caucasian female who underwent EGD 6 months ago. She was noted severe erosive reflux esophagitis and also felt about esophagus. Biopsy was not taken because of ongoing inflammation. She has been double dose PPI along with reflex measures. She feels much better. She says she's had for heartburn in the last 6 months. She denies dysphagia nausea vomiting abdominal pain or melena.  Past Medical History:  Diagnosis Date  . Dermatofibrosarcoma protuberans    right dorsal foot tumor  . Fibrocystic breast   . GERD (gastroesophageal reflux disease)   . Hiatal hernia   . History of esophageal stricture    w/ dilatation 01-/ 2018  . History of HPV infection   . Psoriasis    back of scalp  . Wears glasses     Past Surgical History:  Procedure Laterality Date  . BIOPSY  03/22/2016   Procedure: BIOPSY;  Surgeon: Rogene Houston, MD;  [facility];  Service: Endoscopy;;  esophagus  . CHOLECYSTECTOMY OPEN  1980   and Appendectomy  . D & C HYSTEROSCOPY W/ ENDOMETRIAL ABLATION  06/30/2005  . ESOPHAGEAL DILATION N/A 03/22/2016   Procedure: ESOPHAGEAL DILATION;  Surgeon: Rogene Houston, MD;  [facility];  Service: Endoscopy;  Laterality: N/A;  . ESOPHAGOGASTRODUODENOSCOPY N/A 03/22/2016   Procedure: ESOPHAGOGASTRODUODENOSCOPY (EGD);  Surgeon: Rogene Houston, MD;  [facility];  Service: Endoscopy;  Laterality: N/A;  1:45  . EXCISION LESION FOOT Right 07/04/2016   Procedure: WIDE LOCAL EXCISION OF RIGHT DORSAL FOOT;  Surgeon: Hall Busing, MD;  Location: [place];  Service: General;  Laterality: Right;  . EXCISION OF BREAST BIOPSY Left 03/07/1999   w/ needle localization (benign)  . SHOULDER ARTHROSCOPY W/ ROTATOR CUFF REPAIR Right 2003  approx.  . TUBAL LIGATION Bilateral yrs ago    Family History  Problem Relation Age of Onset  . Hypertension  Mother   . Osteoporosis Mother   . Cancer Father        prostate cancer   Social History:  reports that she quit smoking about 2 months ago. Her smoking use included Cigarettes. She smoked 0.00 packs per day for 20.00 years. She has never used smokeless tobacco. She reports that she drinks alcohol. She reports that she does not use drugs.  Allergies: No Known Allergies  Medications Prior to Admission  Medication Sig Dispense Refill  . Cholecalciferol (VITAMIN D3) 5000 units TABS Take 5,000 Units by mouth daily.    . Multiple Minerals-Vitamins (CAL MAG ZINC +D3 PO) Take 1 tablet by mouth daily.    . naproxen sodium (ANAPROX) 220 MG tablet Take 440 mg by mouth daily as needed (pain).    . pantoprazole (PROTONIX) 40 MG tablet Take 1 tablet (40 mg total) by mouth 2 (two) times daily before a meal. 60 tablet 5  . oxyCODONE-acetaminophen (PERCOCET/ROXICET) 5-325 MG tablet Take 1-2 tablets by mouth every 6 (six) hours as needed for severe pain. (Patient not taking: Reported on 08/30/2016) 30 tablet 0    No results found for this or any previous visit (from the past 48 hour(s)). No results found.  ROS  Blood pressure 113/71, pulse 61, temperature 97.6 F (36.4 C), temperature source Oral, resp. rate 14, SpO2 99 %. Physical Exam  Constitutional: She appears well-developed and well-nourished.  HENT:  Mouth/Throat: Oropharynx is clear and moist.  Eyes:  Conjunctivae are normal. No scleral icterus.  Neck: No thyromegaly present.  Cardiovascular: Normal rate, regular rhythm and normal heart sounds.   No murmur heard. Respiratory: Effort normal and breath sounds normal.  GI: Soft. She exhibits no distension and no mass. There is no tenderness.  Musculoskeletal: She exhibits no edema.  Lymphadenopathy:    She has no cervical adenopathy.  Neurological: She is alert.  Skin: Skin is warm and dry.     Assessment/Plan Erosive esophagitis and Barrett's esophagus suspected. EGD with  biopsy.  Hildred Laser, MD 09/01/2016, 2:42 PM

## 2016-09-05 ENCOUNTER — Encounter (HOSPITAL_COMMUNITY): Payer: Self-pay | Admitting: Internal Medicine

## 2016-09-05 LAB — H. PYLORI ANTIBODY, IGG: H Pylori IgG: <0.48 Index Value (ref 0.00–0.79)

## 2016-09-06 ENCOUNTER — Other Ambulatory Visit (INDEPENDENT_AMBULATORY_CARE_PROVIDER_SITE_OTHER): Payer: Self-pay | Admitting: Internal Medicine

## 2016-09-06 MED ORDER — FAMOTIDINE 20 MG PO TABS: 20.0000 mg | ORAL_TABLET | Freq: Every day | ORAL | 3 refills | Status: DC

## 2016-09-06 MED ORDER — PANTOPRAZOLE SODIUM 40 MG PO TBEC: 40.0000 mg | DELAYED_RELEASE_TABLET | Freq: Every day | ORAL | 3 refills | Status: DC

## 2016-10-13 ENCOUNTER — Encounter (INDEPENDENT_AMBULATORY_CARE_PROVIDER_SITE_OTHER): Payer: Self-pay | Admitting: Internal Medicine

## 2016-10-13 NOTE — Progress Notes (Signed)
Patient was given an appointment for 04/17/16 at 3:30pm with Dr. Laural Golden.  A letter was mailed to the patient.

## 2017-02-20 DIAGNOSIS — M8580 Other specified disorders of bone density and structure, unspecified site: Secondary | ICD-10-CM

## 2017-02-20 DIAGNOSIS — Z780 Asymptomatic menopausal state: Secondary | ICD-10-CM

## 2017-02-20 HISTORY — DX: Asymptomatic menopausal state: Z78.0

## 2017-02-20 HISTORY — DX: Other specified disorders of bone density and structure, unspecified site: M85.80

## 2017-04-11 ENCOUNTER — Encounter: Payer: Self-pay | Admitting: Adult Health

## 2017-04-11 ENCOUNTER — Ambulatory Visit: Payer: BLUE CROSS/BLUE SHIELD | Admitting: Adult Health

## 2017-04-11 ENCOUNTER — Other Ambulatory Visit: Payer: Self-pay

## 2017-04-11 VITALS — BP 118/80 | HR 81 | Resp 16 | Ht 65.5 in | Wt 224.0 lb

## 2017-04-11 DIAGNOSIS — Z1212 Encounter for screening for malignant neoplasm of rectum: Secondary | ICD-10-CM | POA: Diagnosis not present

## 2017-04-11 DIAGNOSIS — Z01419 Encounter for gynecological examination (general) (routine) without abnormal findings: Secondary | ICD-10-CM | POA: Diagnosis not present

## 2017-04-11 DIAGNOSIS — Z1211 Encounter for screening for malignant neoplasm of colon: Secondary | ICD-10-CM

## 2017-04-11 LAB — HEMOCCULT GUIAC POC 1CARD (OFFICE): Fecal Occult Blood, POC: NEGATIVE

## 2017-04-11 NOTE — Progress Notes (Signed)
Patient ID: Sarah Avila, female   DOB: 20-Feb-1961, 57 y.o.   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   Current Medications, Allergies, Past Medical History, Past Surgical History, Family History and Social History were reviewed in [employer] record.     Review of Systems:  Patient denies any headaches, hearing loss, fatigue, blurred vision, shortness of breath, chest pain, abdominal pain, problems with bowel movements, urination, or intercourse. No joint pain or mood swings.   Physical Exam:BP 118/80 (BP Location: Right Arm, Patient Position: Sitting, Cuff Size: Large)   Pulse 81   Resp 16   Ht 5' 5.5" (1.664 m)   Wt 224 lb (101.6 kg)   BMI 36.71 kg/m  General:  Well developed, well nourished, no acute distress Skin:  Warm and dry Neck:  Midline trachea, normal thyroid, good ROM, no lymphadenopathy Lungs; Clear to auscultation bilaterally Breast:  No dominant palpable mass, retraction, or nipple discharge Cardiovascular: Regular rate and rhythm Abdomen:  Soft, non tender, no hepatosplenomegaly Pelvic:  External genitalia is normal in appearance, no lesions.  The vagina is normal in appearance. Urethra has no lesions or masses. The cervix is bulbous, and smooth.  Uterus is felt to be normal size, shape, and contour.  No adnexal masses or tenderness noted.Bladder is non tender, no masses felt. Rectal: Good sphincter tone, no polyps, or hemorrhoids felt.  Hemoccult negative. Extremities/musculoskeletal:  No swelling or varicosities noted, no clubbing or cyanosis Psych:  No mood changes, alert and cooperative,seems happy PHQ 2 score 0.   Impression:  1. Encounter for well woman exam with routine  gynecological exam   2. Screening for colorectal cancer      Plan: Pap and physical in 1 year Mammogram yearly Colonoscopy per GI Labs with PCP To F/U with dermatology in May

## 2017-04-17 ENCOUNTER — Ambulatory Visit (INDEPENDENT_AMBULATORY_CARE_PROVIDER_SITE_OTHER): Payer: BLUE CROSS/BLUE SHIELD | Admitting: Internal Medicine

## 2017-04-17 VITALS — BP 130/88 | Wt 221.2 lb

## 2017-04-17 DIAGNOSIS — K2100 Gastro-esophageal reflux disease with esophagitis, without bleeding: Secondary | ICD-10-CM

## 2017-04-17 DIAGNOSIS — K2270 Barrett's esophagus without dysplasia: Secondary | ICD-10-CM | POA: Diagnosis not present

## 2017-04-17 MED ORDER — PANTOPRAZOLE SODIUM 40 MG PO TBEC: 40.0000 mg | DELAYED_RELEASE_TABLET | Freq: Every day | ORAL | 3 refills | Status: DC

## 2017-04-17 NOTE — Progress Notes (Signed)
Presenting complaint;  Follow-up for GERD complicated by long segment Barrett's esophagus.  Database and subjective:  Patient is 57 year old Caucasian female who underwent EGD back in January 2018 for symptoms of GERD and dysphagia.  Esophageal mucosal appearance was suggestive of eosinophilic esophagitis with biopsy was negative.  She had soft stricture at GE junction which was dilated to 18 mm.  She also had erosive esophagitis and changes suggestive of Barrett's.  Biopsy was not taken because of inflammation.  He was treated with double dose PPI and return for follow-up EGD in July 2018.  Esophagitis had healed.  She was noted to have a 4 cm long segment of Barrett's mucosa with few small squamous islands.  Biopsy revealed Barrett's without dysplasia. Patient has been maintained on pantoprazole twice daily.  Patient is here for scheduled visit.  She states she is doing well.  She may have 1-3 episodes of heartburn per month.  Most of these episodes are mild and self-limiting.  She only had one episode of significant heartburn with regurgitation after she ate food with tomato sauce.  She denies dysphagia hoarseness sore throat or chronic cough.  She is not having any side effects with pantoprazole.  She has not experienced dysphagia anymore.  She has formed stool daily.  She denies melena or rectal bleeding. Last colonoscopy was 8 years ago. Patient has gained 24 pounds since her office visit of November 2017.  She states she ankle issues and was not able to walk and gained weight up to 230 pounds which she weighed in November 2018.  She is now back to walking and watching her diet and she has lost 9 pounds from her peak weight of 230 pounds 2-1/2 months ago. Patient states her family history is positive for osteoporosis in her mother and aunts.  She states she has never been tested.   Current Medications: Outpatient Encounter Medications as of 04/17/2017  Medication Sig  . Cholecalciferol  (VITAMIN D3) 5000 units TABS Take 5,000 Units by mouth daily.  . famotidine (PEPCID) 20 MG tablet Take 1 tablet (20 mg total) by mouth at bedtime.  . Multiple Minerals-Vitamins (CAL MAG ZINC +D3 PO) Take 1 tablet by mouth daily.  . pantoprazole (PROTONIX) 40 MG tablet Take 1 tablet (40 mg total) by mouth daily before breakfast.  . naproxen sodium (ANAPROX) 220 MG tablet Take 440 mg by mouth daily as needed (pain).   No facility-administered encounter medications on file as of 04/17/2017.      Objective: Blood pressure 130/88, weight 221 lb 3.2 oz (100.3 kg). Patient is alert and in no acute distress. Conjunctiva is pink. Sclera is nonicteric Oropharyngeal mucosa is normal. No neck masses or thyromegaly noted. Cardiac exam with regular rhythm normal S1 and S2. No murmur or gallop noted. Lungs are clear to auscultation. Abdomenis full but soft and nontender without organomegaly or masses. No LE edema or clubbing noted.    Assessment:  #1.  Chronic GERD complicated by 4 cm long Barrett's segment.  Biopsy in July 2018 was negative for dysplasia.  She also had a soft stricture at GE junction which was dilated.  She remains free of dysphagia.  GERD symptoms are well controlled with double dose PPI.  She is ready for trial with dose reduction.  She will need to be on PPI therapy chronically because of Barrett's.  #2.  Family history of osteoporosis.  Other risk factors include gender and PPI therapy.  Plan:  Decrease pantoprazole to 40 mg p.o. every  morning.  New prescription sent to patient's pharmacy. Pepcid OTC 20 mg p.o. nightly as needed. Will schedule patient for bone density study. Office visit in 1 year.

## 2017-04-17 NOTE — Patient Instructions (Signed)
Can use Pepcid OTC on as-needed basis.

## 2017-04-18 ENCOUNTER — Encounter (INDEPENDENT_AMBULATORY_CARE_PROVIDER_SITE_OTHER): Payer: Self-pay | Admitting: Internal Medicine

## 2017-04-18 ENCOUNTER — Other Ambulatory Visit (INDEPENDENT_AMBULATORY_CARE_PROVIDER_SITE_OTHER): Payer: Self-pay | Admitting: Internal Medicine

## 2017-04-18 DIAGNOSIS — Z79899 Other long term (current) drug therapy: Secondary | ICD-10-CM

## 2017-04-25 ENCOUNTER — Ambulatory Visit (HOSPITAL_COMMUNITY)
Admission: RE | Admit: 2017-04-25 | Discharge: 2017-04-25 | Disposition: A | Discharge status: Home / Self Care | Payer: BLUE CROSS/BLUE SHIELD | Source: Ambulatory Visit | Attending: Internal Medicine | Admitting: Internal Medicine

## 2017-04-25 DIAGNOSIS — Z79899 Other long term (current) drug therapy: Secondary | ICD-10-CM

## 2017-04-25 DIAGNOSIS — M8589 Other specified disorders of bone density and structure, multiple sites: Secondary | ICD-10-CM | POA: Insufficient documentation

## 2017-04-25 DIAGNOSIS — Z5181 Encounter for therapeutic drug level monitoring: Secondary | ICD-10-CM | POA: Diagnosis present

## 2017-05-04 ENCOUNTER — Other Ambulatory Visit (INDEPENDENT_AMBULATORY_CARE_PROVIDER_SITE_OTHER): Payer: Self-pay | Admitting: Internal Medicine

## 2017-06-14 ENCOUNTER — Other Ambulatory Visit: Payer: Self-pay | Admitting: Obstetrics and Gynecology

## 2017-06-14 DIAGNOSIS — Z1231 Encounter for screening mammogram for malignant neoplasm of breast: Secondary | ICD-10-CM

## 2017-06-25 ENCOUNTER — Ambulatory Visit (HOSPITAL_COMMUNITY)
Admission: RE | Admit: 2017-06-25 | Discharge: 2017-06-25 | Disposition: A | Discharge status: Home / Self Care | Payer: BLUE CROSS/BLUE SHIELD | Source: Ambulatory Visit | Attending: Obstetrics and Gynecology | Admitting: Obstetrics and Gynecology

## 2017-06-25 DIAGNOSIS — Z1231 Encounter for screening mammogram for malignant neoplasm of breast: Secondary | ICD-10-CM | POA: Diagnosis present

## 2017-07-09 ENCOUNTER — Ambulatory Visit: Payer: BLUE CROSS/BLUE SHIELD

## 2017-09-08 ENCOUNTER — Other Ambulatory Visit (INDEPENDENT_AMBULATORY_CARE_PROVIDER_SITE_OTHER): Payer: Self-pay | Admitting: Internal Medicine

## 2017-12-17 ENCOUNTER — Emergency Department (HOSPITAL_COMMUNITY)
Admission: EM | Admit: 2017-12-17 | Discharge: 2017-12-17 | Disposition: A | Discharge status: Home / Self Care | Payer: BLUE CROSS/BLUE SHIELD | Attending: Emergency Medicine | Admitting: Emergency Medicine

## 2017-12-17 ENCOUNTER — Encounter (HOSPITAL_COMMUNITY): Payer: Self-pay | Admitting: Emergency Medicine

## 2017-12-17 ENCOUNTER — Other Ambulatory Visit: Payer: Self-pay

## 2017-12-17 DIAGNOSIS — W5501XA Bitten by cat, initial encounter: Secondary | ICD-10-CM | POA: Insufficient documentation

## 2017-12-17 DIAGNOSIS — S61251A Open bite of left index finger without damage to nail, initial encounter: Secondary | ICD-10-CM | POA: Diagnosis present

## 2017-12-17 DIAGNOSIS — Y929 Unspecified place or not applicable: Secondary | ICD-10-CM | POA: Diagnosis not present

## 2017-12-17 DIAGNOSIS — Z2914 Encounter for prophylactic rabies immune globin: Secondary | ICD-10-CM | POA: Insufficient documentation

## 2017-12-17 DIAGNOSIS — Z23 Encounter for immunization: Secondary | ICD-10-CM | POA: Diagnosis not present

## 2017-12-17 DIAGNOSIS — Z79899 Other long term (current) drug therapy: Secondary | ICD-10-CM | POA: Diagnosis not present

## 2017-12-17 DIAGNOSIS — Z87891 Personal history of nicotine dependence: Secondary | ICD-10-CM | POA: Diagnosis not present

## 2017-12-17 DIAGNOSIS — Y999 Unspecified external cause status: Secondary | ICD-10-CM | POA: Diagnosis not present

## 2017-12-17 DIAGNOSIS — S61255A Open bite of left ring finger without damage to nail, initial encounter: Secondary | ICD-10-CM | POA: Insufficient documentation

## 2017-12-17 DIAGNOSIS — Y9389 Activity, other specified: Secondary | ICD-10-CM | POA: Insufficient documentation

## 2017-12-17 MED ORDER — RABIES IMMUNE GLOBULIN 150 UNIT/ML IM INJ
INJECTION | INTRAMUSCULAR | Status: AC
  Filled 2017-12-17: qty 14

## 2017-12-17 MED ORDER — RABIES IMMUNE GLOBULIN 150 UNIT/ML IM INJ
20.0000 [IU]/kg | INJECTION | Freq: Once | INTRAMUSCULAR | Status: AC
  Administered 2017-12-17: 1875 [IU] via INTRAMUSCULAR
  Filled 2017-12-17: qty 12.5

## 2017-12-17 MED ORDER — TETANUS-DIPHTH-ACELL PERTUSSIS 5-2.5-18.5 LF-MCG/0.5 IM SUSP
0.5000 mL | Freq: Once | INTRAMUSCULAR | Status: AC
  Administered 2017-12-17: 0.5 mL via INTRAMUSCULAR
  Filled 2017-12-17: qty 0.5

## 2017-12-17 MED ORDER — RABIES VACCINE, PCEC IM SUSR
1.0000 mL | Freq: Once | INTRAMUSCULAR | Status: AC
  Administered 2017-12-17: 1 mL via INTRAMUSCULAR
  Filled 2017-12-17: qty 1

## 2017-12-17 NOTE — Discharge Instructions (Addendum)
Your tetanus status has been updated.  Please make a notation in your records.  Your initial rabies vaccine and immunoglobulin have been given.  He will receive additional vaccine at the specialties unit here at the hospital.  You will be given information on the dates to receive these medications.  Use Tylenol every 4 hours or ibuprofen every 6 hours for soreness related to your bites, and the vaccines that have been given.  Cleanse the wounds to your hands daily.  Use the antibiotic as prescribed by your primary physician.  Return to the emergency department if any changes in your condition, problems, signs of advancing infection, or concerns.

## 2017-12-17 NOTE — ED Triage Notes (Signed)
Pt was bit by a stray cat with no shot records today at 0730 am.  Animal controlled was called.

## 2017-12-17 NOTE — ED Provider Notes (Signed)
[facility] EMERGENCY DEPARTMENT Provider Note   CSN: [id number] Arrival date & time: 12/17/17  1759     History   Chief Complaint Chief Complaint  Patient presents with  . Animal Bite    HPI JENISIS HARMSEN is a 57 y.o. female.  Patient states that she sustained a bite to the left hand from a stray cat.  Animal control has been called, and traps have been set to try to capture the cat.  The patient has been seen by her primary physician, and antibiotics have been ordered.  The primary physician wanted the patient to come to the emergency department for tetanus, as well as for rabies shots as there is a question as to whether or not they will be able to capture the stray cat.  The history is provided by the patient.  Animal Bite  Contact animal:  Cat Location:  Finger Finger injury location:  L index finger and L ring finger Time since incident:  13 hours Pain details:    Severity:  Mild   Timing:  Constant   Progression:  Unchanged Incident location:  Home Provoked: unprovoked   Notifications:  Animal control Animal's rabies vaccination status:  Unknown Animal captured: Traps set by [employer].   Tetanus status:  Out of date Worsened by:  Nothing Ineffective treatments:  None tried Associated symptoms: no fever and no numbness     Past Medical History:  Diagnosis Date  . Dermatofibrosarcoma protuberans    right dorsal foot tumor  . Fibrocystic breast   . GERD (gastroesophageal reflux disease)   . Hiatal hernia   . History of esophageal stricture    w/ dilatation 01-/ 2018  . History of HPV infection   . Psoriasis    back of scalp  . Wears glasses     Patient Active Problem List   Diagnosis Date Noted  . Barrett's esophagus without dysplasia 08/03/2016  . Dermatofibrosarcoma protuberans of right lower extremity 06/13/2016    Class: Acute  . Esophageal dysphagia 01/19/2016  . LLQ pain 03/31/2015  . Constipation 03/31/2015  . HPV (human papilloma  virus) infection 08/08/2012  . Nicotine addiction 08/08/2012    Past Surgical History:  Procedure Laterality Date  . BIOPSY  03/22/2016   Procedure: BIOPSY;  Surgeon: Rogene Houston, MD;  [facility];  Service: Endoscopy;;  esophagus  . BIOPSY N/A 09/01/2016   Procedure: BIOPSY;  Surgeon: Rogene Houston, MD;  [facility];  Service: Endoscopy;  Laterality: N/A;  esophageal  . CHOLECYSTECTOMY OPEN  1980   and Appendectomy  . D & C HYSTEROSCOPY W/ ENDOMETRIAL ABLATION  06/30/2005  . ESOPHAGEAL DILATION N/A 03/22/2016   Procedure: ESOPHAGEAL DILATION;  Surgeon: Rogene Houston, MD;  [facility];  Service: Endoscopy;  Laterality: N/A;  . ESOPHAGOGASTRODUODENOSCOPY N/A 03/22/2016   Procedure: ESOPHAGOGASTRODUODENOSCOPY (EGD);  Surgeon: Rogene Houston, MD;  [facility];  Service: Endoscopy;  Laterality: N/A;  1:45  . ESOPHAGOGASTRODUODENOSCOPY N/A 09/01/2016   Procedure: ESOPHAGOGASTRODUODENOSCOPY (EGD);  Surgeon: Rogene Houston, MD;  [facility];  Service: Endoscopy;  Laterality: N/A;  1230  . EXCISION LESION FOOT Right 07/04/2016   Procedure: WIDE LOCAL EXCISION OF RIGHT DORSAL FOOT;  Surgeon: Hall Busing, MD;  Location: [place];  Service: General;  Laterality: Right;  . EXCISION OF BREAST BIOPSY Left 03/07/1999   w/ needle localization (benign)  . SHOULDER ARTHROSCOPY W/ ROTATOR CUFF REPAIR Right  2003  approx.  . TUBAL LIGATION Bilateral yrs ago     OB History    Gravida  1   Para  1   Term  1   Preterm      AB      Living  1     SAB      TAB      Ectopic      Multiple      Live Births  1            Home Medications    Prior to Admission medications   Medication Sig Start Date End Date Taking? Authorizing Provider  Cholecalciferol (VITAMIN D3) 5000 units TABS Take 5,000 Units by mouth daily.    [provider]  famotidine (PEPCID) 20 MG tablet TAKE 1 TABLET BY MOUTH  EVERYDAY AT BEDTIME 09/10/17   Rehman, Mechele Dawley, MD  Multiple Minerals-Vitamins (CAL MAG ZINC +D3 PO) Take 1 tablet by mouth daily.    [provider]  pantoprazole (PROTONIX) 40 MG tablet TAKE 1 TABLET (40 MG TOTAL) BY MOUTH 2 (TWO) TIMES DAILY BEFORE A MEAL. 05/08/17   Setzer, Rona Ravens, NP    Family History Family History  Problem Relation Age of Onset  . Hypertension Mother   . Osteoporosis Mother   . Cancer Father        prostate cancer    Social History Social History   Tobacco Use  . Smoking status: Former Smoker    Packs/day: 0.00    Years: 20.00    Pack years: 0.00    Types: Cigarettes    Last attempt to quit: 06/22/2016    Years since quitting: 1.4  . Smokeless tobacco: Never Used  . Tobacco comment: 1 cigarettes /day.   Substance Use Topics  . Alcohol use: Yes    Comment: 1-2 mixed drinks a week  . Drug use: No     Allergies   Patient has no known allergies.   Review of Systems Review of Systems  Constitutional: Negative for activity change and fever.       All ROS Neg except as noted in HPI  HENT: Negative for nosebleeds.   Eyes: Negative for photophobia and discharge.  Respiratory: Negative for cough, shortness of breath and wheezing.   Cardiovascular: Negative for chest pain and palpitations.  Gastrointestinal: Negative for abdominal pain and blood in stool.  Genitourinary: Negative for dysuria, frequency and hematuria.  Musculoskeletal: Negative for arthralgias, back pain and neck pain.  Skin: Negative.        Cat bite to fingers.  Neurological: Negative for dizziness, seizures, speech difficulty and numbness.  Psychiatric/Behavioral: Negative for confusion and hallucinations.     Physical Exam Updated Vital Signs BP 130/72 (BP Location: Right Arm)   Pulse 71   Temp 98.1 F (36.7 C) (Oral)   Resp 18   Ht 5' 5.5" (1.664 m)   Wt 95.3 kg   SpO2 100%   BMI 34.41 kg/m   Physical Exam  Constitutional: She is oriented to person, place,  and time. She appears well-developed and well-nourished.  Non-toxic appearance.  HENT:  Head: Normocephalic.  Right Ear: Tympanic membrane and external ear normal.  Left Ear: Tympanic membrane and external ear normal.  Eyes: Pupils are equal, round, and reactive to light. EOM and lids are normal.  Neck: Normal range of motion. Neck supple. Carotid bruit is not present.  Cardiovascular: Normal rate, regular rhythm, normal heart sounds, intact distal pulses and normal  pulses.  Pulmonary/Chest: Breath sounds normal. No respiratory distress.  Abdominal: Soft. Bowel sounds are normal. There is no tenderness. There is no guarding.  Musculoskeletal: Normal range of motion. She exhibits tenderness.       Left wrist: She exhibits tenderness.       Arms:      Left hand: She exhibits tenderness.       Hands: Lymphadenopathy:       Head (right side): No submandibular adenopathy present.       Head (left side): No submandibular adenopathy present.    She has no cervical adenopathy.  Neurological: She is alert and oriented to person, place, and time. She has normal strength. No cranial nerve deficit or sensory deficit.  Skin: Skin is warm and dry.  Psychiatric: She has a normal mood and affect. Her speech is normal.  Nursing note and vitals reviewed.    ED Treatments / Results  Labs (all labs ordered are listed, but only abnormal results are displayed) Labs Reviewed - No data to display  EKG None  Radiology No results found.  Procedures Procedures (including critical care time)  Medications Ordered in ED Medications - No data to display   Initial Impression / Assessment and Plan / ED Course  I have reviewed the triage vital signs and the nursing notes.  Pertinent labs & imaging results that were available during my care of the patient were reviewed by me and considered in my medical decision making (see chart for details).       Final Clinical Impressions(s) / ED  Diagnoses MDM Patient sustained a bite to the left hand by a stray cat.  There is also scratches of the left wrist.  The patient was seen by her primary physician.  She was started on antibiotics.  It was suggested to her that she come to the emergency department for tetanus and for rabies shots, as there is question as to whether or not they will be able to find the stray cat.  Patient treated in the emergency department with tetanus, rabies immunoglobulin and rabies vaccine.  The wounds were cleansed and Band-Aids applied.  Patient to return to this facility specialties unit for completion of the rabies vaccines.  Animal control has been notified, and traps have been sent to attempt to catch the cat.  Questions have been answered.  Patient is in agreement with this plan.    Final diagnoses:  Cat bite, initial encounter    ED Discharge Orders    None       Lily Kocher, PA-C 12/18/17 1007    Nat Christen, MD 12/19/17 2007

## 2017-12-20 ENCOUNTER — Encounter (HOSPITAL_COMMUNITY)
Admission: RE | Admit: 2017-12-20 | Discharge: 2017-12-20 | Disposition: A | Discharge status: Home / Self Care | Payer: BLUE CROSS/BLUE SHIELD | Source: Ambulatory Visit | Attending: Emergency Medicine | Admitting: Emergency Medicine

## 2017-12-20 DIAGNOSIS — S61452A Open bite of left hand, initial encounter: Secondary | ICD-10-CM | POA: Insufficient documentation

## 2017-12-20 DIAGNOSIS — Z23 Encounter for immunization: Secondary | ICD-10-CM | POA: Insufficient documentation

## 2017-12-20 DIAGNOSIS — Z203 Contact with and (suspected) exposure to rabies: Secondary | ICD-10-CM | POA: Insufficient documentation

## 2017-12-20 MED ORDER — RABIES VACCINE, PCEC IM SUSR
1.0000 mL | Freq: Once | INTRAMUSCULAR | Status: AC
  Administered 2017-12-20: 1 mL via INTRAMUSCULAR

## 2017-12-20 MED ORDER — RABIES VACCINE, PCEC IM SUSR
INTRAMUSCULAR | Status: AC
  Filled 2017-12-20: qty 1

## 2017-12-24 ENCOUNTER — Encounter (HOSPITAL_COMMUNITY)
Admission: RE | Admit: 2017-12-24 | Discharge: 2017-12-24 | Disposition: A | Discharge status: Home / Self Care | Payer: BLUE CROSS/BLUE SHIELD | Source: Ambulatory Visit | Attending: Emergency Medicine | Admitting: Emergency Medicine

## 2017-12-24 ENCOUNTER — Encounter (HOSPITAL_COMMUNITY): Payer: Self-pay

## 2017-12-24 DIAGNOSIS — S61452A Open bite of left hand, initial encounter: Secondary | ICD-10-CM | POA: Diagnosis present

## 2017-12-24 DIAGNOSIS — Z203 Contact with and (suspected) exposure to rabies: Secondary | ICD-10-CM | POA: Insufficient documentation

## 2017-12-24 DIAGNOSIS — Z23 Encounter for immunization: Secondary | ICD-10-CM | POA: Insufficient documentation

## 2017-12-24 MED ORDER — RABIES VACCINE, PCEC IM SUSR
1.0000 mL | Freq: Once | INTRAMUSCULAR | Status: AC
  Administered 2017-12-24: 1 mL via INTRAMUSCULAR

## 2017-12-31 ENCOUNTER — Encounter (HOSPITAL_COMMUNITY)
Admission: RE | Admit: 2017-12-31 | Discharge: 2017-12-31 | Disposition: A | Discharge status: Home / Self Care | Payer: BLUE CROSS/BLUE SHIELD | Source: Ambulatory Visit | Attending: Emergency Medicine | Admitting: Emergency Medicine

## 2017-12-31 DIAGNOSIS — Z23 Encounter for immunization: Secondary | ICD-10-CM | POA: Diagnosis not present

## 2017-12-31 MED ORDER — RABIES VACCINE, PCEC IM SUSR
1.0000 mL | Freq: Once | INTRAMUSCULAR | Status: AC
  Administered 2017-12-31: 1 mL via INTRAMUSCULAR

## 2018-04-25 ENCOUNTER — Other Ambulatory Visit (INDEPENDENT_AMBULATORY_CARE_PROVIDER_SITE_OTHER): Payer: Self-pay | Admitting: Internal Medicine

## 2018-05-09 ENCOUNTER — Encounter (INDEPENDENT_AMBULATORY_CARE_PROVIDER_SITE_OTHER): Payer: Self-pay

## 2018-05-21 ENCOUNTER — Ambulatory Visit (INDEPENDENT_AMBULATORY_CARE_PROVIDER_SITE_OTHER): Payer: BLUE CROSS/BLUE SHIELD | Admitting: Internal Medicine

## 2018-07-23 ENCOUNTER — Other Ambulatory Visit: Payer: Self-pay

## 2018-07-23 ENCOUNTER — Ambulatory Visit (INDEPENDENT_AMBULATORY_CARE_PROVIDER_SITE_OTHER): Payer: BLUE CROSS/BLUE SHIELD | Admitting: Internal Medicine

## 2018-07-23 ENCOUNTER — Encounter (INDEPENDENT_AMBULATORY_CARE_PROVIDER_SITE_OTHER): Payer: Self-pay | Admitting: Internal Medicine

## 2018-07-23 VITALS — BP 108/67 | HR 71 | Temp 98.1°F | Resp 18 | Ht 65.0 in | Wt 231.4 lb

## 2018-07-23 DIAGNOSIS — K2100 Gastro-esophageal reflux disease with esophagitis, without bleeding: Secondary | ICD-10-CM

## 2018-07-23 DIAGNOSIS — K2270 Barrett's esophagus without dysplasia: Secondary | ICD-10-CM

## 2018-07-23 NOTE — Patient Instructions (Signed)
Please call office if you experience swallowing difficulty or medication stops working

## 2018-07-23 NOTE — Progress Notes (Signed)
Presenting complaint;  Follow-up for chronic GERD.  Database and subjective:  Patient is 58 year old Caucasian female who has history of GERD.  She underwent EGD and January 2018 revealing grade C reflux esophagitis as well as Barrett's mucosa which was not biopsied because of ongoing inflammation.  She returned for repeat EGD in July 2018 revealing healed esophagitis.  She had 4 cm segment of Barrett's and biopsy was negative for dysplasia.  She was last seen in February 2019 and was doing well.  Patient states she continues to do well.  She is taking pantoprazole in the morning and famotidine in the evening maybe every other day.  She has heartburn no more than once a week and regurgitation no more than once a month.  She denies throat symptoms dysphagia or hoarseness.  She also denies abdominal pain melena or rectal bleeding.  Her bowels usually move every day.  She had bone density study last year which revealed osteopenia. She has gained 10 pounds since his last visit.  She states weight gain is occurred because she stays at home since COVID-19 pandemic started and eating more than she has been.  Current Medications: Outpatient Encounter Medications as of 07/23/2018  Medication Sig  . Cholecalciferol (VITAMIN D3) 5000 units TABS Take 5,000 Units by mouth daily.  . famotidine (PEPCID) 20 MG tablet TAKE 1 TABLET BY MOUTH EVERYDAY AT BEDTIME  . Multiple Minerals-Vitamins (CAL MAG ZINC +D3 PO) Take 1 tablet by mouth daily.  . pantoprazole (PROTONIX) 40 MG tablet TAKE 1 TABLET BY MOUTH DAILY BEFORE BREAKFAST.   No facility-administered encounter medications on file as of 07/23/2018.      Objective: Blood pressure 108/67, pulse 71, temperature 98.1 F (36.7 C), temperature source Oral, resp. rate 18, height 5\' 5"  (1.651 m), weight 231 lb 6.4 oz (105 kg). Patient is alert and in no acute distress. Conjunctiva is pink. Sclera is nonicteric Oropharyngeal mucosa is normal. No neck masses or  thyromegaly noted. Cardiac exam with regular rhythm normal S1 and S2. No murmur or gallop noted. Lungs are clear to auscultation. Abdomen is full but soft and nontender with organomegaly or masses. No LE edema or clubbing noted.    Assessment:  #1.  Chronic GERD.  She has history of erosive reflux esophagitis.  Follow-up EGD and July 2018 revealed healed esophagitis.  Disease is also been complicated by 4 cm long Barrett's esophagus and biopsies were negative for dysplasia.  Would consider EGD in July 2023 unless she develops dysphagia or symptom control was not satisfactory.   Plan:  Patient will continue pantoprazole 40 mg every morning and famotidine 20 mg in the evening on as-needed basis. Patient also counseled for the need to lose weight.  She will have to decrease calorie intake. Office visit in 1 year.

## 2018-07-29 ENCOUNTER — Other Ambulatory Visit (HOSPITAL_COMMUNITY): Payer: Self-pay | Admitting: Internal Medicine

## 2018-07-29 DIAGNOSIS — Z1231 Encounter for screening mammogram for malignant neoplasm of breast: Secondary | ICD-10-CM

## 2018-08-05 ENCOUNTER — Ambulatory Visit (HOSPITAL_COMMUNITY): Payer: BC Managed Care – PPO

## 2018-08-19 ENCOUNTER — Other Ambulatory Visit: Payer: Self-pay

## 2018-08-19 ENCOUNTER — Ambulatory Visit (HOSPITAL_COMMUNITY)
Admission: RE | Admit: 2018-08-19 | Discharge: 2018-08-19 | Disposition: A | Discharge status: Home / Self Care | Payer: BC Managed Care – PPO | Source: Ambulatory Visit | Attending: Internal Medicine | Admitting: Internal Medicine

## 2018-08-19 DIAGNOSIS — Z1231 Encounter for screening mammogram for malignant neoplasm of breast: Secondary | ICD-10-CM | POA: Diagnosis not present

## 2018-08-22 ENCOUNTER — Other Ambulatory Visit: Payer: BC Managed Care – PPO

## 2018-08-22 ENCOUNTER — Other Ambulatory Visit: Payer: Self-pay

## 2018-08-22 DIAGNOSIS — Z20822 Contact with and (suspected) exposure to covid-19: Secondary | ICD-10-CM

## 2018-08-29 LAB — NOVEL CORONAVIRUS, NAA: SARS-CoV-2, NAA: NOT DETECTED

## 2018-09-12 ENCOUNTER — Other Ambulatory Visit (INDEPENDENT_AMBULATORY_CARE_PROVIDER_SITE_OTHER): Payer: Self-pay | Admitting: Internal Medicine

## 2019-01-08 ENCOUNTER — Other Ambulatory Visit: Payer: Self-pay

## 2019-01-08 DIAGNOSIS — Z20822 Contact with and (suspected) exposure to covid-19: Secondary | ICD-10-CM

## 2019-01-10 LAB — NOVEL CORONAVIRUS, NAA: SARS-CoV-2, NAA: NOT DETECTED

## 2019-04-18 ENCOUNTER — Encounter (INDEPENDENT_AMBULATORY_CARE_PROVIDER_SITE_OTHER): Payer: Self-pay | Admitting: *Deleted

## 2019-05-04 ENCOUNTER — Other Ambulatory Visit (INDEPENDENT_AMBULATORY_CARE_PROVIDER_SITE_OTHER): Payer: Self-pay | Admitting: Internal Medicine

## 2019-05-08 ENCOUNTER — Encounter: Payer: Self-pay | Admitting: Adult Health

## 2019-05-08 ENCOUNTER — Other Ambulatory Visit (HOSPITAL_COMMUNITY)
Admission: RE | Admit: 2019-05-08 | Discharge: 2019-05-08 | Disposition: A | Discharge status: Home / Self Care | Payer: 59 | Source: Ambulatory Visit | Attending: Adult Health | Admitting: Adult Health

## 2019-05-08 ENCOUNTER — Other Ambulatory Visit: Payer: Self-pay

## 2019-05-08 ENCOUNTER — Ambulatory Visit (INDEPENDENT_AMBULATORY_CARE_PROVIDER_SITE_OTHER): Payer: 59 | Admitting: Adult Health

## 2019-05-08 VITALS — BP 103/70 | HR 75 | Ht 65.5 in | Wt 231.5 lb

## 2019-05-08 DIAGNOSIS — Z1212 Encounter for screening for malignant neoplasm of rectum: Secondary | ICD-10-CM | POA: Diagnosis not present

## 2019-05-08 DIAGNOSIS — Z01419 Encounter for gynecological examination (general) (routine) without abnormal findings: Secondary | ICD-10-CM | POA: Insufficient documentation

## 2019-05-08 DIAGNOSIS — Z1211 Encounter for screening for malignant neoplasm of colon: Secondary | ICD-10-CM

## 2019-05-08 LAB — HEMOCCULT GUIAC POC 1CARD (OFFICE): Fecal Occult Blood, POC: NEGATIVE

## 2019-05-08 NOTE — Progress Notes (Addendum)
Patient ID: Sarah Avila, female   DOB: 08/08/1960, 59 y.o.   MRN: [id number] History of Present Illness:  Milia, Medwid", is a 59 year old white female,divorced, PM in for a well woman gyn exam and pap. PCP is Dr Nevada Crane.   Current Medications, Allergies, Past Medical History, Past Surgical History, Family History and Social History were reviewed in [employer] record.     Review of Systems: Patient denies any headaches, hearing loss, fatigue, blurred vision, shortness of breath, chest pain, abdominal pain, problems with bowel movements, urination, or intercourse. No joint pain or mood swings. +weight gain     Physical Exam:BP 103/70 (BP Location: Left Arm, Patient Position: Sitting, Cuff Size: Normal)   Pulse 75   Ht 5' 5.5" (1.664 m)   Wt 231 lb 8 oz (105 kg)   BMI 37.94 kg/m  General:  Well developed, well nourished, no acute distress Skin:  Warm and dry Neck:  Midline trachea, normal thyroid, good ROM, no lymphadenopathy Lungs; Clear to auscultation bilaterally Breast:  No dominant palpable mass, retraction, or nipple discharge Cardiovascular: Regular rate and rhythm Abdomen:  Soft, non tender, no hepatosplenomegaly Pelvic:  External genitalia is normal in appearance, no lesions.  The vagina is normal in appearance. Urethra has no lesions or masses. The cervix is smooth, pap with high risk HPV 16/18 genotyping performed.  Uterus is felt to be normal size, shape, and contour.  No adnexal masses or tenderness noted.Bladder is non tender, no masses felt. Rectal: Good sphincter tone, no polyps, or hemorrhoids felt.  Hemoccult negative. Extremities/musculoskeletal:  No swelling or varicosities noted, no clubbing or cyanosis Psych:  No mood changes, alert and cooperative,seems happy Fall risk is low PHQ 2 score is 0. Examination chaperoned by Estill Bamberg Rash LPN.  Impression and Plan:   1. Encounter for gynecological examination with Papanicolaou smear of  cervix Pap sent Physical in 1 year Pap in 3 if normal Mammogram yearly Labs with PCP DEXA per Dr Laural Golden  2. Screening for colorectal cancer Colonoscopy per Dr Laural Golden, has appt in June to see him and will get EGD too

## 2019-05-09 LAB — CYTOLOGY - PAP
Comment: NEGATIVE
Diagnosis: NEGATIVE
High risk HPV: NEGATIVE

## 2019-06-26 ENCOUNTER — Other Ambulatory Visit: Payer: Self-pay

## 2019-06-26 ENCOUNTER — Ambulatory Visit: Payer: 59 | Attending: Internal Medicine

## 2019-06-26 DIAGNOSIS — Z20822 Contact with and (suspected) exposure to covid-19: Secondary | ICD-10-CM

## 2019-06-27 LAB — SARS-COV-2, NAA 2 DAY TAT

## 2019-06-27 LAB — NOVEL CORONAVIRUS, NAA: SARS-CoV-2, NAA: NOT DETECTED

## 2019-07-15 ENCOUNTER — Other Ambulatory Visit (HOSPITAL_COMMUNITY): Payer: Self-pay | Admitting: Internal Medicine

## 2019-07-15 DIAGNOSIS — Z1231 Encounter for screening mammogram for malignant neoplasm of breast: Secondary | ICD-10-CM

## 2019-07-22 ENCOUNTER — Ambulatory Visit (INDEPENDENT_AMBULATORY_CARE_PROVIDER_SITE_OTHER): Payer: 59 | Admitting: Gastroenterology

## 2019-08-20 ENCOUNTER — Other Ambulatory Visit: Payer: Self-pay

## 2019-08-20 ENCOUNTER — Ambulatory Visit (HOSPITAL_COMMUNITY)
Admission: RE | Admit: 2019-08-20 | Discharge: 2019-08-20 | Disposition: A | Discharge status: Home / Self Care | Payer: 59 | Source: Ambulatory Visit | Attending: Internal Medicine | Admitting: Internal Medicine

## 2019-08-20 DIAGNOSIS — Z1231 Encounter for screening mammogram for malignant neoplasm of breast: Secondary | ICD-10-CM | POA: Diagnosis not present

## 2019-09-21 ENCOUNTER — Other Ambulatory Visit (INDEPENDENT_AMBULATORY_CARE_PROVIDER_SITE_OTHER): Payer: Self-pay | Admitting: Internal Medicine

## 2019-10-13 ENCOUNTER — Encounter (INDEPENDENT_AMBULATORY_CARE_PROVIDER_SITE_OTHER): Payer: Self-pay | Admitting: Gastroenterology

## 2019-10-13 ENCOUNTER — Other Ambulatory Visit: Payer: Self-pay

## 2019-10-13 ENCOUNTER — Ambulatory Visit (INDEPENDENT_AMBULATORY_CARE_PROVIDER_SITE_OTHER): Payer: 59 | Admitting: Gastroenterology

## 2019-10-13 VITALS — BP 108/59 | HR 71 | Temp 97.7°F | Ht 65.0 in | Wt 219.0 lb

## 2019-10-13 DIAGNOSIS — K2270 Barrett's esophagus without dysplasia: Secondary | ICD-10-CM

## 2019-10-13 DIAGNOSIS — K2100 Gastro-esophageal reflux disease with esophagitis, without bleeding: Secondary | ICD-10-CM | POA: Diagnosis not present

## 2019-10-13 DIAGNOSIS — Z1211 Encounter for screening for malignant neoplasm of colon: Secondary | ICD-10-CM | POA: Diagnosis not present

## 2019-10-13 MED ORDER — PANTOPRAZOLE SODIUM 40 MG PO TBEC: 40.0000 mg | DELAYED_RELEASE_TABLET | Freq: Every day | ORAL | 3 refills | Status: DC

## 2019-10-13 NOTE — Progress Notes (Signed)
Patient profile: Sarah Avila is a 59 y.o. female seen for yearly follow-up-last seen 2020   History of Present Illness: Sarah Avila is seen today for yearly follow-up of GERD.  She reports she is typically doing well with Protonix once a day-she generally takes this before lunch or dinner.  She uses Pepcid and Tums on an as needed basis.  She has identified trigger foods and avoids these-coffee does tend to trigger.  She denies any nausea, vomiting, dysphagia, epigastric pain.  She drinks alcohol approximately 2 drinks a week.  Denies NSAIDs.  Denies any significant lower GI symptoms-occasionally has constipation if eats a lot of chocolate, otherwise has bowel movement daily without any diarrhea, abdominal pain, blood in stool.   She is trying to lose weight as below.  She is drinking Slim fast most mornings for breakfast.  She has had some difficulty with becoming more active given a heel spur. Has lost abt 12 lbs over past 6 months.   Wt Readings from Last 3 Encounters:  10/13/19 219 lb (99.3 kg)  05/08/19 231 lb 8 oz (105 kg)  07/23/18 231 lb 6.4 oz (105 kg)     Last Colonoscopy: age 10 - [place] - normal.     Last Endoscopy:   She underwent EGD and January 2018 revealing grade C reflux esophagitis as well as Barrett's mucosa which was not biopsied because of ongoing inflammation.  She returned for repeat EGD in July 2018 revealing healed esophagitis.  She had 4 cm segment of Barrett's and biopsy was negative for dysplasia.     Past Medical History:  Past Medical History:  Diagnosis Date  . Dermatofibrosarcoma protuberans    right dorsal foot tumor  . Fibrocystic breast   . GERD (gastroesophageal reflux disease)   . Hiatal hernia   . History of esophageal stricture    w/ dilatation 01-/ 2018  . History of HPV infection   . Osteopenia after menopause 2019  . Psoriasis    back of scalp  . Wears glasses     Problem List: Patient Active Problem List   Diagnosis  Date Noted  . Screening for colorectal cancer 05/08/2019  . Encounter for gynecological examination with Papanicolaou smear of cervix 05/08/2019  . Barrett's esophagus without dysplasia 08/03/2016  . Dermatofibrosarcoma protuberans of right lower extremity 06/13/2016    Class: Acute  . Esophageal dysphagia 01/19/2016  . LLQ pain 03/31/2015  . Constipation 03/31/2015  . HPV (human papilloma virus) infection 08/08/2012  . Nicotine addiction 08/08/2012    Past Surgical History: Past Surgical History:  Procedure Laterality Date  . BIOPSY  03/22/2016   Procedure: BIOPSY;  Surgeon: Rogene Houston, MD;  [facility];  Service: Endoscopy;;  esophagus  . BIOPSY N/A 09/01/2016   Procedure: BIOPSY;  Surgeon: Rogene Houston, MD;  [facility];  Service: Endoscopy;  Laterality: N/A;  esophageal  . CHOLECYSTECTOMY OPEN  1980   and Appendectomy  . D & C HYSTEROSCOPY W/ ENDOMETRIAL ABLATION  06/30/2005  . ESOPHAGEAL DILATION N/A 03/22/2016   Procedure: ESOPHAGEAL DILATION;  Surgeon: Rogene Houston, MD;  [facility];  Service: Endoscopy;  Laterality: N/A;  . ESOPHAGOGASTRODUODENOSCOPY N/A 03/22/2016   Procedure: ESOPHAGOGASTRODUODENOSCOPY (EGD);  Surgeon: Rogene Houston, MD;  [facility];  Service: Endoscopy;  Laterality: N/A;  1:45  . ESOPHAGOGASTRODUODENOSCOPY N/A 09/01/2016   Procedure: ESOPHAGOGASTRODUODENOSCOPY (EGD);  Surgeon: Rogene Houston, MD;  [facility];  Service: Endoscopy;  Laterality: N/A;  1230  . EXCISION LESION FOOT Right 07/04/2016   Procedure: WIDE LOCAL EXCISION OF RIGHT DORSAL FOOT;  Surgeon: Hall Busing, MD;  Location: [facility];  Service: General;  Laterality: Right;  . EXCISION OF BREAST BIOPSY Left 03/07/1999   w/ needle localization (benign)  . SHOULDER ARTHROSCOPY W/ ROTATOR CUFF REPAIR Right 2003  approx.  . TUBAL LIGATION Bilateral yrs ago    Allergies: No Known Allergies    Home  Medications:  Current Outpatient Medications:  .  Cholecalciferol (VITAMIN D3) 5000 units TABS, Take 5,000 Units by mouth daily., Disp: , Rfl:  .  famotidine (PEPCID) 20 MG tablet, TAKE 1 TABLET BY MOUTH EVERYDAY AT BEDTIME, Disp: 90 tablet, Rfl: 3 .  Multiple Minerals-Vitamins (CAL MAG ZINC +D3 PO), Take 1 tablet by mouth daily., Disp: , Rfl:  .  pantoprazole (PROTONIX) 40 MG tablet, TAKE 1 TABLET BY MOUTH EVERY DAY BEFORE BREAKFAST, Disp: 90 tablet, Rfl: 3   Family History: family history includes Cancer in her father; Hypertension in her mother; Osteoporosis in her mother.    Social History:   reports that she quit smoking about 3 years ago. Her smoking use included cigarettes. She smoked 0.00 packs per day for 20.00 years. She has never used smokeless tobacco. She reports current alcohol use. She reports that she does not use drugs.   Review of Systems: Constitutional: Denies weight loss/weight gain  Eyes: No changes in vision. ENT: No oral lesions, sore throat.  GI: see HPI.  Heme/Lymph: No easy bruising.  CV: No chest pain.  GU: No hematuria.  Integumentary: No rashes.  Neuro: No headaches.  Psych: No depression/anxiety.  Endocrine: No heat/cold intolerance.  Allergic/Immunologic: No urticaria.  Resp: No cough, SOB.  Musculoskeletal: No joint swelling.    Physical Examination: BP (!) 108/59 (BP Location: Right Arm, Patient Position: Sitting, Cuff Size: Normal)   Pulse 71   Temp 97.7 F (36.5 C) (Oral)   Ht 5\' 5"  (1.651 m)   Wt 219 lb (99.3 kg)   BMI 36.44 kg/m  Gen: NAD, alert and oriented x 4 HEENT: PEERLA, EOMI, Neck: supple, no JVD Chest: CTA bilaterally, no wheezes, crackles, or other adventitious sounds CV: RRR, no m/g/c/r Abd: soft, NT, ND, +BS in all four quadrants; no HSM, guarding, ridigity, or rebound tenderness Ext: no edema, well perfused with 2+ pulses, Skin: no rash or lesions noted on observed skin Lymph: no noted LAD  Data Reviewed:   Per  patient has labs at Dr Durene Cal office and has been normal.   Assessment/Plan: Ms. Huertas is a 60 y.o. female    Dela was seen today for follow-up.  Diagnoses and all orders for this visit:  Gastroesophageal reflux disease with esophagitis, unspecified whether hemorrhage  Barrett's esophagus without dysplasia  Colon cancer screening    1.  GERD-symptoms well controlled on current regimen without any alarm symptoms. Continue PPI once a day. Diet modifications reviewed including decreasing coffee which tends to trigger symptoms. Congratulated on weight loss.   2.  Colon cancer screening-last colonoscopy 10 years ago. No family history of colon polyps or colon cancer.  Due at this time.  3.  History of Barrett's-last EGD 3 years ago.  We will plan for repeat endoscopy at time of her colonoscopy for surveillance.   She would like to call to schedule EGD/colonoscopy-orders placed today.  No prior issues with sedation.   Patient denies CP, SOB, and use of blood  thinners. I discussed the risks and benefits of procedure including bleeding, perforation, infection, missed lesions, medication reactions and possible hospitalization or surgery if complications. All questions answered.    I personally performed the service, non-incident to. (WP)  Laurine Blazer, [facility]

## 2019-10-13 NOTE — Patient Instructions (Signed)
please call us when you would like to schedule endoscopy colonoscopy.  I sent refills on Protonix to pharmacy

## 2019-10-14 ENCOUNTER — Telehealth (INDEPENDENT_AMBULATORY_CARE_PROVIDER_SITE_OTHER): Payer: Self-pay | Admitting: *Deleted

## 2019-10-14 ENCOUNTER — Encounter (INDEPENDENT_AMBULATORY_CARE_PROVIDER_SITE_OTHER): Payer: Self-pay | Admitting: *Deleted

## 2019-10-14 MED ORDER — SUTAB 1479-225-188 MG PO TABS: 1.0000 | ORAL_TABLET | Freq: Once | ORAL | 0 refills | Status: AC

## 2019-10-14 NOTE — Telephone Encounter (Signed)
Patient needs Sutab (copay card) ° °

## 2019-11-28 ENCOUNTER — Other Ambulatory Visit (INDEPENDENT_AMBULATORY_CARE_PROVIDER_SITE_OTHER): Payer: Self-pay | Admitting: *Deleted

## 2019-12-02 ENCOUNTER — Other Ambulatory Visit (HOSPITAL_COMMUNITY)
Admission: RE | Admit: 2019-12-02 | Discharge: 2019-12-02 | Disposition: A | Discharge status: Home / Self Care | Payer: 59 | Source: Ambulatory Visit | Attending: Internal Medicine | Admitting: Internal Medicine

## 2019-12-02 ENCOUNTER — Other Ambulatory Visit: Payer: Self-pay

## 2019-12-02 DIAGNOSIS — Z20822 Contact with and (suspected) exposure to covid-19: Secondary | ICD-10-CM | POA: Diagnosis not present

## 2019-12-02 DIAGNOSIS — Z01818 Encounter for other preprocedural examination: Secondary | ICD-10-CM | POA: Insufficient documentation

## 2019-12-03 LAB — SARS CORONAVIRUS 2 (TAT 6-24 HRS): SARS Coronavirus 2: NEGATIVE

## 2019-12-04 ENCOUNTER — Encounter (HOSPITAL_COMMUNITY)
Admission: RE | Disposition: A | Discharge status: Home / Self Care | Payer: Self-pay | Source: Home / Self Care | Attending: Internal Medicine

## 2019-12-04 ENCOUNTER — Ambulatory Visit (HOSPITAL_COMMUNITY)
Admission: RE | Admit: 2019-12-04 | Discharge: 2019-12-04 | Disposition: A | Discharge status: Home / Self Care | Payer: 59 | Attending: Internal Medicine | Admitting: Internal Medicine

## 2019-12-04 ENCOUNTER — Other Ambulatory Visit: Payer: Self-pay

## 2019-12-04 ENCOUNTER — Encounter (HOSPITAL_COMMUNITY): Payer: Self-pay | Admitting: Internal Medicine

## 2019-12-04 DIAGNOSIS — Z87891 Personal history of nicotine dependence: Secondary | ICD-10-CM | POA: Diagnosis not present

## 2019-12-04 DIAGNOSIS — Z1211 Encounter for screening for malignant neoplasm of colon: Secondary | ICD-10-CM

## 2019-12-04 DIAGNOSIS — K2270 Barrett's esophagus without dysplasia: Secondary | ICD-10-CM | POA: Diagnosis not present

## 2019-12-04 DIAGNOSIS — K449 Diaphragmatic hernia without obstruction or gangrene: Secondary | ICD-10-CM | POA: Insufficient documentation

## 2019-12-04 DIAGNOSIS — K2289 Other specified disease of esophagus: Secondary | ICD-10-CM | POA: Diagnosis not present

## 2019-12-04 DIAGNOSIS — D120 Benign neoplasm of cecum: Secondary | ICD-10-CM | POA: Insufficient documentation

## 2019-12-04 DIAGNOSIS — K219 Gastro-esophageal reflux disease without esophagitis: Secondary | ICD-10-CM | POA: Diagnosis not present

## 2019-12-04 DIAGNOSIS — Z79899 Other long term (current) drug therapy: Secondary | ICD-10-CM | POA: Insufficient documentation

## 2019-12-04 DIAGNOSIS — K2100 Gastro-esophageal reflux disease with esophagitis, without bleeding: Secondary | ICD-10-CM

## 2019-12-04 HISTORY — PX: COLONOSCOPY: SHX5424

## 2019-12-04 HISTORY — PX: ESOPHAGOGASTRODUODENOSCOPY: SHX5428

## 2019-12-04 HISTORY — PX: BIOPSY: SHX5522

## 2019-12-04 LAB — HM COLONOSCOPY

## 2019-12-04 SURGERY — COLONOSCOPY
Anesthesia: Moderate Sedation

## 2019-12-04 MED ORDER — MIDAZOLAM HCL 5 MG/5ML IJ SOLN
INTRAMUSCULAR | Status: AC
  Filled 2019-12-04: qty 10

## 2019-12-04 MED ORDER — SODIUM CHLORIDE 0.9 % IV SOLN: INTRAVENOUS | Status: DC

## 2019-12-04 MED ORDER — MIDAZOLAM HCL 5 MG/5ML IJ SOLN
INTRAMUSCULAR | Status: AC
  Filled 2019-12-04: qty 5

## 2019-12-04 MED ORDER — MEPERIDINE HCL 50 MG/ML IJ SOLN
INTRAMUSCULAR | Status: DC | PRN
Start: 2019-12-04 — End: 2019-12-04
  Administered 2019-12-04 (×2): 25 mg

## 2019-12-04 MED ORDER — MEPERIDINE HCL 50 MG/ML IJ SOLN
INTRAMUSCULAR | Status: AC
  Filled 2019-12-04: qty 1

## 2019-12-04 MED ORDER — STERILE WATER FOR IRRIGATION IR SOLN
Status: DC | PRN
  Administered 2019-12-04: 5 mL

## 2019-12-04 MED ORDER — LIDOCAINE VISCOUS HCL 2 % MT SOLN
OROMUCOSAL | Status: DC | PRN
  Administered 2019-12-04: 1 via OROMUCOSAL

## 2019-12-04 MED ORDER — MIDAZOLAM HCL 5 MG/5ML IJ SOLN
INTRAMUSCULAR | Status: DC | PRN
  Administered 2019-12-04 (×2): 2 mg via INTRAVENOUS
  Administered 2019-12-04 (×2): 1 mg via INTRAVENOUS
  Administered 2019-12-04 (×2): 2 mg via INTRAVENOUS
  Administered 2019-12-04: 1 mg via INTRAVENOUS

## 2019-12-04 MED ORDER — LIDOCAINE VISCOUS HCL 2 % MT SOLN
OROMUCOSAL | Status: AC
  Filled 2019-12-04: qty 15

## 2019-12-04 NOTE — Op Note (Signed)
[facility] Patient Name: Sarah Avila Procedure Date: 12/04/2019 1:20 PM MRN: [id number] Date of Birth: 22-Jul-1960 Attending MD: Hildred Laser , MD CSN: [id number] Age: 59 Admit Type: Outpatient Procedure:                Upper GI endoscopy Indications:              Barrett's esophagus, Follow-up of Barrett's                            esophagus Providers:                Hildred Laser, MD, Janeece Riggers, RN, Kristine L.                            Risa Grill, Technician, Aram Candela Referring MD:             Delphina Cahill, MD Medicines:                Lidocaine spray, Meperidine 50 mg IV, Midazolam 9                            mg IV Complications:            No immediate complications. Estimated Blood Loss:     Estimated blood loss was minimal. Procedure:                Pre-Anesthesia Assessment:                           - Prior to the procedure, a History and Physical                            was performed, and patient medications and                            allergies were reviewed. The patient's tolerance of                            previous anesthesia was also reviewed. The risks                            and benefits of the procedure and the sedation                            options and risks were discussed with the patient.                            All questions were answered, and informed consent                            was obtained. Prior Anticoagulants: The patient has                            taken no previous anticoagulant or antiplatelet  agents. ASA Grade Assessment: II - A patient with                            mild systemic disease. After reviewing the risks                            and benefits, the patient was deemed in                            satisfactory condition to undergo the procedure.                           After obtaining informed consent, the endoscope was                            passed under direct  vision. Throughout the                            procedure, the patient's blood pressure, pulse, and                            oxygen saturations were monitored continuously. The                            GIF-H190 ([id number]) scope was introduced through the                            mouth, and advanced to the second part of duodenum.                            The upper GI endoscopy was accomplished without                            difficulty. The patient tolerated the procedure                            well. Scope In: 1:59:03 PM Scope Out: 2:10:58 PM Total Procedure Duration: 0 hours 11 minutes 55 seconds  Findings:      The hypopharynx was normal.      The proximal esophagus and mid esophagus were normal.      There were esophageal mucosal changes secondary to established       long-segment Barrett's disease present in the distal esophagus. The       maximum longitudinal extent of these mucosal changes was 4 cm in length.       Mucosa was biopsied with a cold forceps for histology randomly       {skip}proximal and distal areas. A total of 2 specimen bottles were sent       to pathology.      The Z-line was irregular and was found 35 cm from the incisors.      A 3 cm hiatal hernia was present.      The entire examined stomach was normal.      The duodenal bulb and second portion of the duodenum were normal. Impression:               -  Normal hypopharynx.                           - Normal proximal esophagus and mid esophagus.                           - Esophageal mucosal changes secondary to                            established long-segment Barrett's disease.                            Proximal margin at 31 cm from incisors. Length is 4                            cm. Biopsies taken from proximal and distal areas.                           - Z-line irregular, 35 cm from the incisors.                           - 3 cm hiatal hernia.                           - Normal  stomach.                           - Normal duodenal bulb and second portion of the                            duodenum. Moderate Sedation:      Moderate (conscious) sedation was administered by the endoscopy nurse       and supervised by the endoscopist. The following parameters were       monitored: oxygen saturation, heart rate, blood pressure, CO2       capnography and response to care. Total physician intraservice time was       17 minutes. Recommendation:           - Patient has a contact number available for                            emergencies. The signs and symptoms of potential                            delayed complications were discussed with the                            patient. Return to normal activities tomorrow.                            Written discharge instructions were provided to the                            patient.                           -  Resume previous diet today.                           - Continue present medications.                           - Await pathology results.                           - Repeat upper endoscopy for surveillance based on                            pathology results.                           - Return to GI clinic in 1 year. Procedure Code(s):        --- Professional ---                           [phone], Esophagogastroduodenoscopy, flexible,                            transoral; with biopsy, single or multiple                           G0500, Moderate sedation services provided by the                            same physician or other qualified health care                            professional performing a gastrointestinal                            endoscopic service that sedation supports,                            requiring the presence of an independent trained                            observer to assist in the monitoring of the                            patient's level of consciousness and physiological                             status; initial 15 minutes of intra-service time;                            patient age 1 years or older (additional time may                            be reported with [phone], as appropriate) Diagnosis Code(s):        --- Professional ---  K22.70, Barrett's esophagus without dysplasia                           K22.8, Other specified diseases of esophagus                           K44.9, Diaphragmatic hernia without obstruction or                            gangrene CPT copyright 2019 American Medical Association. All rights reserved. The codes documented in this report are preliminary and upon coder review may  be revised to meet current compliance requirements. Hildred Laser, MD Hildred Laser, MD 12/04/2019 3:25:43 PM This report has been signed electronically. Number of Addenda: 0

## 2019-12-04 NOTE — Discharge Instructions (Signed)
No aspirin or NSAIDs for 24 hours. Resume usual medications and diet as before. No driving for 24 hours. Physician will call with biopsy results.        Upper Endoscopy, Adult, Care After This sheet gives you information about how to care for yourself after your procedure. Your health care provider may also give you more specific instructions. If you have problems or questions, contact your health care provider. What can I expect after the procedure? After the procedure, it is common to have:  A sore throat.  Mild stomach pain or discomfort.  Bloating.  Nausea. Follow these instructions at home:   Follow instructions from your health care provider about what to eat or drink after your procedure.  Return to your normal activities as told by your health care provider. Ask your health care provider what activities are safe for you.  Take over-the-counter and prescription medicines only as told by your health care provider.  Do not drive for 24 hours if you were given a sedative during your procedure.  Keep all follow-up visits as told by your health care provider. This is important. Contact a health care provider if you have:  A sore throat that lasts longer than one day.  Trouble swallowing. Get help right away if:  You vomit blood or your vomit looks like coffee grounds.  You have: ? A fever. ? Bloody, black, or tarry stools. ? A severe sore throat or you cannot swallow. ? Difficulty breathing. ? Severe pain in your chest or abdomen. Summary  After the procedure, it is common to have a sore throat, mild stomach discomfort, bloating, and nausea.  Do not drive for 24 hours if you were given a sedative during the procedure.  Follow instructions from your health care provider about what to eat or drink after your procedure.  Return to your normal activities as told by your health care provider. This information is not intended to replace advice given to you by  your health care provider. Make sure you discuss any questions you have with your health care provider. Document Revised: 07/31/2017 Document Reviewed: 07/09/2017 Elsevier Patient Education  [place].    Colonoscopy, Adult, Care After This sheet gives you information about how to care for yourself after your procedure. Your doctor may also give you more specific instructions. If you have problems or questions, call your doctor. What can I expect after the procedure? After the procedure, it is common to have:  A small amount of blood in your poop (stool) for 24 hours.  Some gas.  Mild cramping or bloating in your belly (abdomen). Follow these instructions at home: Eating and drinking   Drink enough fluid to keep your pee (urine) pale yellow.  Follow instructions from your doctor about what you cannot eat or drink.  Return to your normal diet as told by your doctor. Avoid heavy or fried foods that are hard to digest. Activity  Rest as told by your doctor.  Do not sit for a long time without moving. Get up to take short walks every 1-2 hours. This is important. Ask for help if you feel weak or unsteady.  Return to your normal activities as told by your doctor. Ask your doctor what activities are safe for you. To help cramping and bloating:   Try walking around.  Put heat on your belly as told by your doctor. Use the heat source that your doctor recommends, such as a moist heat pack or a  heating pad. ? Put a towel between your skin and the heat source. ? Leave the heat on for 20-30 minutes. ? Remove the heat if your skin turns bright red. This is very important if you are unable to feel pain, heat, or cold. You may have a greater risk of getting burned. General instructions  For the first 24 hours after the procedure: ? Do not drive or use machinery. ? Do not sign important documents. ? Do not drink alcohol. ? Do your daily activities more slowly than  normal. ? Eat foods that are soft and easy to digest.  Take over-the-counter or prescription medicines only as told by your doctor.  Keep all follow-up visits as told by your doctor. This is important. Contact a doctor if:  You have blood in your poop 2-3 days after the procedure. Get help right away if:  You have more than a small amount of blood in your poop.  You see large clumps of tissue (blood clots) in your poop.  Your belly is swollen.  You feel like you may vomit (nauseous).  You vomit.  You have a fever.  You have belly pain that gets worse, and medicine does not help your pain. Summary  After the procedure, it is common to have a small amount of blood in your poop. You may also have mild cramping and bloating in your belly.  For the first 24 hours after the procedure, do not drive or use machinery, do not sign important documents, and do not drink alcohol.  Get help right away if you have a lot of blood in your poop, feel like you may vomit, have a fever, or have more belly pain. This information is not intended to replace advice given to you by your health care provider. Make sure you discuss any questions you have with your health care provider. Document Revised: 09/02/2018 Document Reviewed: 09/02/2018 Elsevier Patient Education  [place].

## 2019-12-04 NOTE — H&P (Signed)
Sarah Avila is an 58 y.o. female.   Chief Complaint: Patient is here for esophagogastroduodenoscopy and colonoscopy. HPI: Patient is 59 year old Caucasian female who has long segment Barrett's esophagus who is here for surveillance EGD.  Last exam was in January 2018.  She denies dysphagia.  She feels heartburn is well controlled with dietary changes medications.  She tries not to any trigger foods.  She denies abdominal pain melena rectal bleeding or change in her bowel habits.  Last colonoscopy was normal 10 years ago. Family history is negative for CRC.  Past Medical History:  Diagnosis Date  . Dermatofibrosarcoma protuberans    right dorsal foot tumor  . Fibrocystic breast   . GERD (gastroesophageal reflux disease)   . Hiatal hernia   . History of esophageal stricture    w/ dilatation 01-/ 2018  . History of HPV infection   . Osteopenia after menopause 2019  . Psoriasis    back of scalp  . Wears glasses     Past Surgical History:  Procedure Laterality Date  . BIOPSY  03/22/2016   Procedure: BIOPSY;  Surgeon: Rogene Houston, MD;  [facility];  Service: Endoscopy;;  esophagus  . BIOPSY N/A 09/01/2016   Procedure: BIOPSY;  Surgeon: Rogene Houston, MD;  Location: [facility];  Service: Endoscopy;  Laterality: N/A;  esophageal  . CHOLECYSTECTOMY OPEN  1980   and Appendectomy  . D & C HYSTEROSCOPY W/ ENDOMETRIAL ABLATION  06/30/2005  . ESOPHAGEAL DILATION N/A 03/22/2016   Procedure: ESOPHAGEAL DILATION;  Surgeon: Rogene Houston, MD;  [facility]: [facility];  Service: Endoscopy;  Laterality: N/A;  . ESOPHAGOGASTRODUODENOSCOPY N/A 03/22/2016   Procedure: ESOPHAGOGASTRODUODENOSCOPY (EGD);  Surgeon: Rogene Houston, MD;  [facility]: [facility];  Service: Endoscopy;  Laterality: N/A;  1:45  . ESOPHAGOGASTRODUODENOSCOPY N/A 09/01/2016   Procedure: ESOPHAGOGASTRODUODENOSCOPY (EGD);  Surgeon: Rogene Houston, MD;  Location: [facility];  Service: Endoscopy;  Laterality:  N/A;  1230  . EXCISION LESION FOOT Right 07/04/2016   Procedure: WIDE LOCAL EXCISION OF RIGHT DORSAL FOOT;  Surgeon: Hall Busing, MD;  Location: [facility];  Service: General;  Laterality: Right;  . EXCISION OF BREAST BIOPSY Left 03/07/1999   w/ needle localization (benign)  . SHOULDER ARTHROSCOPY W/ ROTATOR CUFF REPAIR Right 2003  approx.  . TUBAL LIGATION Bilateral yrs ago    Family History  Problem Relation Age of Onset  . Hypertension Mother   . Osteoporosis Mother   . Cancer Father        prostate cancer   Social History:  reports that she quit smoking about 3 years ago. Her smoking use included cigarettes. She smoked 0.00 packs per day for 20.00 years. She has never used smokeless tobacco. She reports current alcohol use. She reports that she does not use drugs.  Allergies: No Known Allergies  Medications Prior to Admission  Medication Sig Dispense Refill  . pantoprazole (PROTONIX) 40 MG tablet Take 1 tablet (40 mg total) by mouth daily. 90 tablet 3  . famotidine (PEPCID) 20 MG tablet TAKE 1 TABLET BY MOUTH EVERYDAY AT BEDTIME (Patient not taking: Reported on 11/24/2019) 90 tablet 3    No results found for this or any previous visit (from the past 48 hour(s)). No results found.  Review of Systems  Blood pressure 113/66, temperature 98 F (36.7 C), temperature source Oral, resp. rate (!) 9, height 5\' 5"  (1.651 m), weight 97.5 kg, SpO2 98 %. Physical  Exam HENT:     Mouth/Throat:     Mouth: Mucous membranes are moist.     Pharynx: Oropharynx is clear.  Eyes:     General: No scleral icterus.    Conjunctiva/sclera: Conjunctivae normal.  Cardiovascular:     Rate and Rhythm: Normal rate and regular rhythm.     Heart sounds: Normal heart sounds. No murmur heard.   Pulmonary:     Effort: Pulmonary effort is normal.     Breath sounds: Normal breath sounds.  Abdominal:     General: There is no distension.     Palpations: Abdomen is soft.     Tenderness:  There is no abdominal tenderness.  Musculoskeletal:        General: No swelling.     Cervical back: Neck supple.  Lymphadenopathy:     Cervical: No cervical adenopathy.  Skin:    General: Skin is warm and dry.  Neurological:     Mental Status: She is alert.      Assessment/Plan  Chronic GERD complicated by long segment Barrett's esophagus. Surveillance esophagogastroduodenoscopy and average risk screening colonoscopy.  Hildred Laser, MD 12/04/2019, 1:48 PM

## 2019-12-04 NOTE — Op Note (Signed)
[facility] Patient Name: Sarah Avila Procedure Date: 12/04/2019 2:11 PM MRN: [id number] Date of Birth: 06-Apr-1960 Attending MD: Hildred Laser , MD CSN: [id number] Age: 59 Admit Type: Outpatient Procedure:                Colonoscopy Indications:              Screening for colorectal malignant neoplasm Providers:                Hildred Laser, MD, Janeece Riggers, RN, Lambert Mody, Kristine L. Risa Grill, Technician,                            Aram Candela Referring MD:             Delphina Cahill, MD Medicines:                Midazolam 4 mg IV Complications:            No immediate complications. Estimated Blood Loss:     Estimated blood loss was minimal. Procedure:                Pre-Anesthesia Assessment:                           - Prior to the procedure, a History and Physical                            was performed, and patient medications and                            allergies were reviewed. The patient's tolerance of                            previous anesthesia was also reviewed. The risks                            and benefits of the procedure and the sedation                            options and risks were discussed with the patient.                            All questions were answered, and informed consent                            was obtained. Prior Anticoagulants: The patient has                            taken no previous anticoagulant or antiplatelet                            agents. ASA Grade Assessment: II - A patient with  mild systemic disease. After reviewing the risks                            and benefits, the patient was deemed in                            satisfactory condition to undergo the procedure.                           - Prior to the procedure, a History and Physical                            was performed, and patient medications and                            allergies were  reviewed. The patient's tolerance of                            previous anesthesia was also reviewed. The risks                            and benefits of the procedure and the sedation                            options and risks were discussed with the patient.                            All questions were answered, and informed consent                            was obtained. Prior Anticoagulants: The patient has                            taken no previous anticoagulant or antiplatelet                            agents. ASA Grade Assessment: II - A patient with                            mild systemic disease. After reviewing the risks                            and benefits, the patient was deemed in                            satisfactory condition to undergo the procedure.                           After obtaining informed consent, the colonoscope                            was passed under direct vision. Throughout the  procedure, the patient's blood pressure, pulse, and                            oxygen saturations were monitored continuously. The                            [id number]-H190DL ([id number]) scope was introduced through                            the anus and advanced to the the cecum, identified                            by appendiceal orifice and ileocecal valve. The                            colonoscopy was technically difficult and complex                            due to a redundant transverse colon. Successful                            completion of the procedure was aided by increasing                            the dose of sedation medication, changing the                            patient's position and withdrawing the scope and                            replacing with the 'babyscope'. The patient                            tolerated the procedure well. The quality of the                            bowel preparation was good. The  ileocecal valve,                            appendiceal orifice, and rectum were photographed. Scope In: 2:13:41 PM Scope Out: 3:13:11 PM Scope Withdrawal Time: 0 hours 11 minutes 37 seconds  Total Procedure Duration: 0 hours 59 minutes 30 seconds  Findings:      The perianal and digital rectal examinations were normal.      A 4 to 6 mm polyp was found in the cecum. The polyp was sessile. The       polyp was removed with a cold snare. Resection and retrieval were       complete. The pathology specimen was placed into Bottle Number 3.      The exam was otherwise normal throughout the examined colon.      The retroflexed view of the distal rectum and anal verge was normal and       showed no anal or rectal abnormalities. Impression:               -  One 4 to 6 mm polyp in the cecum, removed with a                            cold snare. Resected and retrieved. Moderate Sedation:      Moderate (conscious) sedation was administered by the endoscopy nurse       and supervised by the endoscopist. The following parameters were       monitored: oxygen saturation, heart rate, blood pressure, CO2       capnography and response to care. Total physician intraservice time was       58 minutes. Recommendation:           - Patient has a contact number available for                            emergencies. The signs and symptoms of potential                            delayed complications were discussed with the                            patient. Return to normal activities tomorrow.                            Written discharge instructions were provided to the                            patient.                           - Resume previous diet today.                           - Continue present medications.                           - No aspirin, ibuprofen, naproxen, or other                            non-steroidal anti-inflammatory drugs for 1 day.                           - Await pathology  results.                           - Repeat colonoscopy is recommended. The                            colonoscopy date will be determined after pathology                            results from today's exam become available for                            review. Procedure Code(s):        --- Professional ---  [phone], Colonoscopy, flexible; with removal of                            tumor(s), polyp(s), or other lesion(s) by snare                            technique                           99153, Moderate sedation; each additional 15                            minutes intraservice time                           99153, Moderate sedation; each additional 15                            minutes intraservice time                           99153, Moderate sedation; each additional 15                            minutes intraservice time                           G0500, Moderate sedation services provided by the                            same physician or other qualified health care                            professional performing a gastrointestinal                            endoscopic service that sedation supports,                            requiring the presence of an independent trained                            observer to assist in the monitoring of the                            patient's level of consciousness and physiological                            status; initial 15 minutes of intra-service time;                            patient age 61 years or older (additional time may                            be reported with [phone], as appropriate) Diagnosis Code(s):        --- Professional ---  Z12.11, Encounter for screening for malignant                            neoplasm of colon                           K63.5, Polyp of colon CPT copyright 2019 [employer]. All rights reserved. The codes documented in this report  are preliminary and upon coder review may  be revised to meet current compliance requirements. Hildred Laser, MD Hildred Laser, MD 12/04/2019 3:30:20 PM This report has been signed electronically. Number of Addenda: 0

## 2019-12-08 LAB — SURGICAL PATHOLOGY

## 2019-12-09 ENCOUNTER — Encounter (HOSPITAL_COMMUNITY): Payer: Self-pay | Admitting: Internal Medicine

## 2020-01-29 ENCOUNTER — Encounter (INDEPENDENT_AMBULATORY_CARE_PROVIDER_SITE_OTHER): Payer: Self-pay | Admitting: *Deleted

## 2020-06-10 ENCOUNTER — Other Ambulatory Visit: Payer: 59 | Admitting: Adult Health

## 2020-06-15 ENCOUNTER — Other Ambulatory Visit: Payer: Self-pay

## 2020-06-15 ENCOUNTER — Ambulatory Visit: Payer: 59 | Admitting: Adult Health

## 2020-06-15 ENCOUNTER — Encounter: Payer: Self-pay | Admitting: Adult Health

## 2020-06-15 VITALS — BP 117/72 | HR 71 | Ht 65.0 in | Wt 219.0 lb

## 2020-06-15 DIAGNOSIS — R350 Frequency of micturition: Secondary | ICD-10-CM

## 2020-06-15 DIAGNOSIS — R319 Hematuria, unspecified: Secondary | ICD-10-CM

## 2020-06-15 DIAGNOSIS — R102 Pelvic and perineal pain unspecified side: Secondary | ICD-10-CM

## 2020-06-15 DIAGNOSIS — R110 Nausea: Secondary | ICD-10-CM | POA: Diagnosis not present

## 2020-06-15 LAB — POCT URINALYSIS DIPSTICK OB
Glucose, UA: NEGATIVE
Ketones, UA: NEGATIVE
Nitrite, UA: NEGATIVE
POC,PROTEIN,UA: NEGATIVE

## 2020-06-15 NOTE — Progress Notes (Signed)
  Subjective:     Patient ID: Sarah Avila, female   DOB: 1960-08-05, 60 y.o.   MRN: [id number]  HPI [name] is a 60 year old white female, divorced, PM in complaining of pelvic pain for a few weeks now and some nausea on and off and urinary frequency. PCP is Dr Nevada Crane  Review of Systems +pelvic pain for few weeks +nausea on and off +urinary frequency Denies any fever, vomiting, diarrhea or constipation Not sexually active. No history of kidney stones  Reviewed past medical,surgical, social and family history. Reviewed medications and allergies.     Objective:   Physical Exam BP 117/72 (BP Location: Left Arm, Patient Position: Sitting, Cuff Size: Normal)   Pulse 71   Ht 5\' 5"  (1.651 m)   Wt 219 lb (99.3 kg)   BMI 36.44 kg/m urine dipstick +blood and leuks Skin warm and dry.Pelvic: external genitalia is normal in appearance no lesions, vagina: pale pink, no lesions,urethra has no lesions or masses noted, cervix:smooth, uterus: normal size, shape and contour,mildly tender, no masses felt, adnexa: no masses or tenderness noted. Bladder is non tender and no masses felt. NO CVAT.  [facility] - 06/15/20 1057      Pregnancy Intention Screening   Does the patient want to become pregnant in the next year? No    Does the patient's partner want to become pregnant in the next year? No    Would the patient like to discuss contraceptive options today? No      Contraception Wrap Up   Current Method Female Sterilization    End Method Female Sterilization    Contraception Counseling Provided No         Examination chaperoned by Estill Bamberg LPN    Assessment:     1. Frequent urination Will get UA C&S to rule out UTI - POC Urinalysis Dipstick OB - Urinalysis, Routine w reflex microscopic - Urine Culture  2. Pelvic pain Will get [place] at [facility] at 3:30 pm to assess uterus and ovaries - US PELVIC COMPLETE WITH TRANSVAGINAL; Future  3. Hematuria, unspecified type UA C&S sent -  Urinalysis, Routine w reflex microscopic - Urine Culture  4. Nausea She declines any meds     Plan:     Follow up prn Will talk when results back

## 2020-06-16 LAB — MICROSCOPIC EXAMINATION: Casts: NONE SEEN /lpf

## 2020-06-16 LAB — URINALYSIS, ROUTINE W REFLEX MICROSCOPIC
Bilirubin, UA: NEGATIVE
Glucose, UA: NEGATIVE
Ketones, UA: NEGATIVE
Nitrite, UA: NEGATIVE
Protein,UA: NEGATIVE
RBC, UA: NEGATIVE
Specific Gravity, UA: 1.017 (ref 1.005–1.030)
Urobilinogen, Ur: 0.2 mg/dL (ref 0.2–1.0)
pH, UA: 7.5 (ref 5.0–7.5)

## 2020-06-19 LAB — URINE CULTURE

## 2020-06-21 ENCOUNTER — Telehealth: Payer: Self-pay | Admitting: Adult Health

## 2020-06-21 ENCOUNTER — Ambulatory Visit (HOSPITAL_COMMUNITY)
Admission: RE | Admit: 2020-06-21 | Discharge: 2020-06-21 | Disposition: A | Discharge status: Home / Self Care | Payer: 59 | Source: Ambulatory Visit | Attending: Adult Health | Admitting: Adult Health

## 2020-06-21 ENCOUNTER — Other Ambulatory Visit: Payer: Self-pay

## 2020-06-21 DIAGNOSIS — R102 Pelvic and perineal pain: Secondary | ICD-10-CM | POA: Diagnosis not present

## 2020-06-21 MED ORDER — NITROFURANTOIN MONOHYD MACRO 100 MG PO CAPS: 100.0000 mg | ORAL_CAPSULE | Freq: Two times a day (BID) | ORAL | 0 refills | Status: DC

## 2020-06-21 NOTE — Telephone Encounter (Signed)
Sarah Avila is aware of urine will rx macrobid

## 2020-07-14 ENCOUNTER — Other Ambulatory Visit (HOSPITAL_COMMUNITY): Payer: Self-pay | Admitting: Internal Medicine

## 2020-07-14 DIAGNOSIS — Z1231 Encounter for screening mammogram for malignant neoplasm of breast: Secondary | ICD-10-CM

## 2020-07-26 ENCOUNTER — Encounter (HOSPITAL_COMMUNITY): Payer: 59

## 2020-07-29 ENCOUNTER — Ambulatory Visit (INDEPENDENT_AMBULATORY_CARE_PROVIDER_SITE_OTHER): Payer: 59 | Admitting: Adult Health

## 2020-07-29 ENCOUNTER — Other Ambulatory Visit: Payer: Self-pay

## 2020-07-29 ENCOUNTER — Encounter: Payer: Self-pay | Admitting: Adult Health

## 2020-07-29 VITALS — BP 118/76 | HR 67 | Ht 65.5 in | Wt 221.0 lb

## 2020-07-29 DIAGNOSIS — Z01419 Encounter for gynecological examination (general) (routine) without abnormal findings: Secondary | ICD-10-CM | POA: Insufficient documentation

## 2020-07-29 DIAGNOSIS — Z1211 Encounter for screening for malignant neoplasm of colon: Secondary | ICD-10-CM | POA: Diagnosis not present

## 2020-07-29 LAB — HEMOCCULT GUIAC POC 1CARD (OFFICE): Fecal Occult Blood, POC: NEGATIVE

## 2020-07-29 NOTE — Progress Notes (Signed)
Patient ID: Sarah Avila, female   DOB: [birth year], 60 y.o.   MRN: [id number] History of Present Illness: [name] is a 60 year old white female, divorced, PM in for a well woman gyn exam. Lab Results  Component Value Date   DIAGPAP  05/08/2019    - Negative for intraepithelial lesion or malignancy (NILM)   HPV NOT DETECTED 02/01/2016   [place] Negative 05/08/2019    PCP is Dr Nevada Crane.  Current Medications, Allergies, Past Medical History, Past Surgical History, Family History and Social History were reviewed in [employer] record.     Review of Systems: Patient denies any headaches, hearing loss, fatigue, blurred vision, shortness of breath, chest pain, abdominal pain, problems with bowel movements, urination, or intercourse.(Not active). No joint pain or mood swings. Denies any vaginal bleeding    Physical Exam:BP 118/76 (BP Location: Right Arm, Patient Position: Sitting, Cuff Size: Normal)   Pulse 67   Ht 5' 5.5" (1.664 m)   Wt 221 lb (100.2 kg)   BMI 36.22 kg/m   General:  Well developed, well nourished, no acute distress Skin:  Warm and dry Neck:  Midline trachea, normal thyroid, good ROM, no lymphadenopathy,no carotid bruits heard  Lungs; Clear to auscultation bilaterally Breast:  No dominant palpable mass, retraction, or nipple discharge Cardiovascular: Regular rate and rhythm Abdomen:  Soft, non tender, no hepatosplenomegaly Pelvic:  External genitalia is normal in appearance, no lesions.  The vagina is normal in appearance. Urethra has no lesions or masses. The cervix is smooth.  Uterus is felt to be normal size, shape, and contour.  No adnexal masses or tenderness noted.Bladder is non tender, no masses felt. Rectal: Good sphincter tone, no polyps, or hemorrhoids felt.  Hemoccult negative. Extremities/musculoskeletal:  No swelling or varicosities noted, no clubbing or cyanosis Psych:  No mood changes, alert and cooperative,seems happy AA is 2 Fall risk  is low PHQ 9 score is 2 GAD 7 score is 1  [facility] - 07/29/20 0932       Pregnancy Intention Screening   Does the patient want to become pregnant in the next year? No    Does the patient's partner want to become pregnant in the next year? No    Would the patient like to discuss contraceptive options today? No      Contraception Wrap Up   Current Method Female Sterilization    End Method Female Sterilization    Contraception Counseling Provided No            Examination chaperoned by Celene Squibb LPN   Impression and Plan: 1. Encounter for well woman exam with routine gynecological exam Physical in 1 year Pap in 2024 Mammogram in July and yearly Colonoscopy per [facility] with PCP  2. Encounter for screening fecal occult blood testing

## 2020-08-25 ENCOUNTER — Ambulatory Visit (HOSPITAL_COMMUNITY): Payer: 59

## 2020-08-26 ENCOUNTER — Ambulatory Visit (HOSPITAL_COMMUNITY)
Admission: RE | Admit: 2020-08-26 | Discharge: 2020-08-26 | Disposition: A | Discharge status: Home / Self Care | Payer: 59 | Source: Ambulatory Visit | Attending: Internal Medicine | Admitting: Internal Medicine

## 2020-08-26 ENCOUNTER — Other Ambulatory Visit: Payer: Self-pay

## 2020-08-26 DIAGNOSIS — Z1231 Encounter for screening mammogram for malignant neoplasm of breast: Secondary | ICD-10-CM | POA: Insufficient documentation

## 2020-09-08 ENCOUNTER — Other Ambulatory Visit: Payer: Self-pay

## 2020-09-08 ENCOUNTER — Encounter (INDEPENDENT_AMBULATORY_CARE_PROVIDER_SITE_OTHER): Payer: Self-pay | Admitting: Gastroenterology

## 2020-09-08 ENCOUNTER — Ambulatory Visit (INDEPENDENT_AMBULATORY_CARE_PROVIDER_SITE_OTHER): Payer: 59 | Admitting: Gastroenterology

## 2020-09-08 VITALS — BP 105/69 | HR 80 | Temp 98.0°F | Ht 65.5 in | Wt 222.0 lb

## 2020-09-08 DIAGNOSIS — E6609 Other obesity due to excess calories: Secondary | ICD-10-CM | POA: Diagnosis not present

## 2020-09-08 DIAGNOSIS — K2270 Barrett's esophagus without dysplasia: Secondary | ICD-10-CM

## 2020-09-08 DIAGNOSIS — K219 Gastro-esophageal reflux disease without esophagitis: Secondary | ICD-10-CM | POA: Diagnosis not present

## 2020-09-08 DIAGNOSIS — R1031 Right lower quadrant pain: Secondary | ICD-10-CM | POA: Diagnosis not present

## 2020-09-08 DIAGNOSIS — E669 Obesity, unspecified: Secondary | ICD-10-CM | POA: Insufficient documentation

## 2020-09-08 DIAGNOSIS — R109 Unspecified abdominal pain: Secondary | ICD-10-CM | POA: Insufficient documentation

## 2020-09-08 DIAGNOSIS — Z6836 Body mass index (BMI) 36.0-36.9, adult: Secondary | ICD-10-CM

## 2020-09-08 MED ORDER — FAMOTIDINE 40 MG PO TABS: 40.0000 mg | ORAL_TABLET | Freq: Every evening | ORAL | 3 refills | Status: DC

## 2020-09-08 NOTE — Patient Instructions (Addendum)
Continue pantoprazole 40 mg qday Start famotidine 40 mg at night Stop Tums Referral to nutrition

## 2020-09-08 NOTE — Progress Notes (Signed)
Sarah Avila, M.D. Gastroenterology & Hepatology [facility] [address] [place], [place] [address]  Primary Care Physician: Celene Squibb, MD [place] [id number]  I will communicate my assessment and recommendations to the referring MD via EMR.  Problems: GERD Nondysplastic Barrett's esophagus  History of Present Illness: Sarah Avila is a 60 y.o. female with past medical history of dermatofibrosarcoma, GERD complicated by nondysplastic Barrett's esophagus, hiatal hernia, who presents for follow up of GERD and evaluation abdominal pain.  The patient was last seen on 10/13/2019. At that time, the patient Was scheduled to undergo EGD and colonoscopy.  Underwent both procedures on 12/04/2019.  Findings are described below.  She was continued on Protonix 40 mg every day.  Patient reports that since November 2021 she presented some recurrent and progressive episodes of abdominal pain in her R flank and RLQ.   She states in January she states that she had a bowel movement that had an unusual appearance but it was brown in color. After passing this bowel movement her pain went away. However, she reports that for the last 3 weeks she has presented some episodes of intermittent pain in the same area, which has had milder severity. Has not taken any pain medications. She usually has a BM every day, usually is soft but occasionally she has to strain to have a BM. Does not take any laxatives.The patient had a transvaginal ultrasound that was negative for any alterations.  She takes Protonix 40 mg in the AM and waits 30-60 minutes to have breakfast. She occasionally takes some Tums at night every night as she has heartburn 5/7 nights. Has never tried Pepcid in the past. No odynophagia or dysphagia.  The patient denies having any nausea, vomiting, fever, chills, hematochezia, melena, hematemesis, abdominal distention, abdominal  pain, diarrhea, jaundice, pruritus. Initially lost some weight unintentionally after implementing a diet but has gained it back.  Last EGD:11/2019 The hypopharynx was normal. Findings: The proximal esophagus and mid esophagus were normal. There were esophageal mucosal changes secondary to established long-segment Barrett's disease present in the distal esophagus. The maximum longitudinal extent of these mucosal changes was 4 cm in length. Mucosa was biopsied with a cold forceps for histology randomly proximal and distal areas. A total of 2 specimen bottles were sent to pathology. Presence of BE but no dysplasia The Z-line was irregular and was found 35 cm from the incisors. A 3 cm hiatal hernia was present. The entire examined stomach was normal. The duodenal bulb and second portion of the duodenum were normal. Last Colonoscopy: 11/2019 - a 6 mm removed from cecum - TA.   She was advised to repeat in 5 years.  Past Medical History: Past Medical History:  Diagnosis Date   Dermatofibrosarcoma protuberans    right dorsal foot tumor   Fibrocystic breast    GERD (gastroesophageal reflux disease)    Hiatal hernia    History of esophageal stricture    w/ dilatation 01-/ 2018   History of HPV infection    Osteopenia after menopause 2019   Psoriasis    back of scalp   Wears glasses     Past Surgical History: Past Surgical History:  Procedure Laterality Date   BIOPSY  03/22/2016   Procedure: BIOPSY;  Surgeon: Rogene Houston, MD;  [facility];  Service: Endoscopy;;  esophagus   BIOPSY N/A 09/01/2016   Procedure: BIOPSY;  Surgeon: Rogene Houston,  MD;  [facility];  Service: Endoscopy;  Laterality: N/A;  esophageal   BIOPSY  12/04/2019   Procedure: BIOPSY;  Surgeon: Rogene Houston, MD;  [facility];  Service: Endoscopy;;   CHOLECYSTECTOMY OPEN  1980   and Appendectomy   COLONOSCOPY N/A 12/04/2019   Procedure: COLONOSCOPY;  Surgeon: Rogene Houston, MD;  [facility];  Service: Endoscopy;  Laterality: N/A;  145   D & C HYSTEROSCOPY W/ ENDOMETRIAL ABLATION  06/30/2005   ESOPHAGEAL DILATION N/A 03/22/2016   Procedure: ESOPHAGEAL DILATION;  Surgeon: Rogene Houston, MD;  [facility];  Service: Endoscopy;  Laterality: N/A;   ESOPHAGOGASTRODUODENOSCOPY N/A 03/22/2016   Procedure: ESOPHAGOGASTRODUODENOSCOPY (EGD);  Surgeon: Rogene Houston, MD;  [facility];  Service: Endoscopy;  Laterality: N/A;  1:45   ESOPHAGOGASTRODUODENOSCOPY N/A 09/01/2016   Procedure: ESOPHAGOGASTRODUODENOSCOPY (EGD);  Surgeon: Rogene Houston, MD;  [facility];  Service: Endoscopy;  Laterality: N/A;  1230   ESOPHAGOGASTRODUODENOSCOPY N/A 12/04/2019   Procedure: ESOPHAGOGASTRODUODENOSCOPY (EGD);  Surgeon: Rogene Houston, MD;  [facility];  Service: Endoscopy;  Laterality: N/A;   EXCISION LESION FOOT Right 07/04/2016   Procedure: WIDE LOCAL EXCISION OF RIGHT DORSAL FOOT;  Surgeon: Hall Busing, MD;  Location: [facility];  Service: General;  Laterality: Right;   EXCISION OF BREAST BIOPSY Left 03/07/1999   w/ needle localization (benign)   SHOULDER ARTHROSCOPY W/ ROTATOR CUFF REPAIR Right 2003  approx.   TUBAL LIGATION Bilateral yrs ago    Family History: Family History  Problem Relation Age of Onset   Hypertension Mother    Osteoporosis Mother    Cancer Father        prostate cancer    Social History: Social History   Tobacco Use  Smoking Status Every Day   Packs/day: 0.00   Years: 20.00   Pack years: 0.00   Types: Cigarettes   Last attempt to quit: 06/22/2016   Years since quitting: 4.2  Smokeless Tobacco Never  Tobacco Comments   1 cigarettes /day.    Social History   Substance and Sexual Activity  Alcohol Use Yes   Comment: 1-2 mixed drinks a week   Social History   Substance and Sexual Activity  Drug Use No    Allergies: No Known  Allergies  Medications: Current Outpatient Medications  Medication Sig Dispense Refill   pantoprazole (PROTONIX) 40 MG tablet Take 1 tablet (40 mg total) by mouth daily. 90 tablet 3   No current facility-administered medications for this visit.    Review of Systems: GENERAL: negative for malaise, night sweats HEENT: No changes in hearing or vision, no nose bleeds or other nasal problems. NECK: Negative for lumps, goiter, pain and significant neck swelling RESPIRATORY: Negative for cough, wheezing CARDIOVASCULAR: Negative for chest pain, leg swelling, palpitations, orthopnea GI: SEE HPI MUSCULOSKELETAL: Negative for joint pain or swelling, back pain, and muscle pain. SKIN: Negative for lesions, rash PSYCH: Negative for sleep disturbance, mood disorder and recent psychosocial stressors. HEMATOLOGY Negative for prolonged bleeding, bruising easily, and swollen nodes. ENDOCRINE: Negative for cold or heat intolerance, polyuria, polydipsia and goiter. NEURO: negative for tremor, gait imbalance, syncope and seizures. The remainder of the review of systems is noncontributory.   Physical Exam: BP 105/69 (BP Location: Right Arm, Patient Position: Sitting, Cuff Size: Large)   Pulse 80   Temp 98 F (36.7 C) (Oral)   Ht 5' 5.5" (1.664 m)  Wt 222 lb (100.7 kg)   BMI 36.38 kg/m  GENERAL: The patient is AO x3, in no acute distress. HEENT: Head is normocephalic and atraumatic. EOMI are intact. Mouth is well hydrated and without lesions. NECK: Supple. No masses LUNGS: Clear to auscultation. No presence of rhonchi/wheezing/rales. Adequate chest expansion HEART: RRR, normal s1 and s2. ABDOMEN: Soft, nontender, no guarding, no peritoneal signs, and nondistended. BS +. No masses. EXTREMITIES: Without any cyanosis, clubbing, rash, lesions or edema. NEUROLOGIC: AOx3, no focal motor deficit. SKIN: no jaundice, no rashes  Imaging/Labs: as above  I personally reviewed and interpreted the  available labs, imaging and endoscopic files.  Impression and Plan: Sarah Avila is a 60 y.o. female with past medical history of dermatofibrosarcoma, GERD complicated by nondysplastic Barrett's esophagus, hiatal hernia, who presents for follow up of GERD and evaluation abdominal pain. Patient has presented partial control of her symptoms with her PPI. Will start her on famotidine at night instead of Tums. Achieving an adequate level of acid suppression will help decreasing her risk of further progression of her Barrett's esophagus.  Patient understood and agreed.  We will proceed with an abdominal ultrasound to evaluate her abdominal pain but she would benefit from taking MiraLAX frequently to loosen her bowel movements as these may have led to her abdominal pain.  She is interested in losing weight, I will refer her to a nutritionist for further guidance regarding her diet.  - Continue pantoprazole 40 mg qday -Start famotidine 40 mg at night -Stop Tums -Referral to nutrition - Schedule abdominal US -Start taking Miralax 1 capful every day    All questions were answered.      Harvel Quale, MD Gastroenterology and Hepatology [facility]

## 2020-09-10 ENCOUNTER — Other Ambulatory Visit (INDEPENDENT_AMBULATORY_CARE_PROVIDER_SITE_OTHER): Payer: Self-pay

## 2020-09-10 DIAGNOSIS — R109 Unspecified abdominal pain: Secondary | ICD-10-CM

## 2020-09-17 ENCOUNTER — Ambulatory Visit (HOSPITAL_COMMUNITY)
Admission: RE | Admit: 2020-09-17 | Discharge: 2020-09-17 | Disposition: A | Discharge status: Home / Self Care | Payer: 59 | Source: Ambulatory Visit | Attending: Gastroenterology | Admitting: Gastroenterology

## 2020-09-17 ENCOUNTER — Other Ambulatory Visit: Payer: Self-pay

## 2020-09-17 DIAGNOSIS — R109 Unspecified abdominal pain: Secondary | ICD-10-CM | POA: Diagnosis not present

## 2020-09-20 ENCOUNTER — Other Ambulatory Visit (INDEPENDENT_AMBULATORY_CARE_PROVIDER_SITE_OTHER): Payer: Self-pay | Admitting: Gastroenterology

## 2020-09-20 MED ORDER — DICYCLOMINE HCL 10 MG PO CAPS
10.0000 mg | ORAL_CAPSULE | Freq: Two times a day (BID) | ORAL | 1 refills | Status: DC | PRN
Start: 2020-09-20 — End: 2022-08-28

## 2020-11-08 ENCOUNTER — Encounter: Payer: 59 | Admitting: Nutrition

## 2020-12-02 ENCOUNTER — Ambulatory Visit: Payer: 59 | Admitting: Nutrition

## 2021-04-21 ENCOUNTER — Other Ambulatory Visit (INDEPENDENT_AMBULATORY_CARE_PROVIDER_SITE_OTHER): Payer: Self-pay | Admitting: *Deleted

## 2021-04-21 MED ORDER — PANTOPRAZOLE SODIUM 40 MG PO TBEC: 40.0000 mg | DELAYED_RELEASE_TABLET | Freq: Every day | ORAL | 0 refills | Status: DC

## 2021-06-22 ENCOUNTER — Ambulatory Visit: Payer: 59 | Admitting: Nutrition

## 2021-07-14 ENCOUNTER — Other Ambulatory Visit (HOSPITAL_COMMUNITY): Payer: Self-pay | Admitting: Adult Health

## 2021-07-14 DIAGNOSIS — Z1231 Encounter for screening mammogram for malignant neoplasm of breast: Secondary | ICD-10-CM

## 2021-07-15 ENCOUNTER — Other Ambulatory Visit (INDEPENDENT_AMBULATORY_CARE_PROVIDER_SITE_OTHER): Payer: Self-pay | Admitting: Gastroenterology

## 2021-08-04 ENCOUNTER — Other Ambulatory Visit (INDEPENDENT_AMBULATORY_CARE_PROVIDER_SITE_OTHER): Payer: Self-pay | Admitting: Gastroenterology

## 2021-09-05 ENCOUNTER — Ambulatory Visit (HOSPITAL_COMMUNITY)
Admission: RE | Admit: 2021-09-05 | Discharge: 2021-09-05 | Disposition: A | Discharge status: Home / Self Care | Payer: 59 | Source: Ambulatory Visit | Attending: Adult Health | Admitting: Adult Health

## 2021-09-05 DIAGNOSIS — Z1231 Encounter for screening mammogram for malignant neoplasm of breast: Secondary | ICD-10-CM | POA: Insufficient documentation

## 2021-11-03 ENCOUNTER — Other Ambulatory Visit (INDEPENDENT_AMBULATORY_CARE_PROVIDER_SITE_OTHER): Payer: Self-pay | Admitting: Gastroenterology

## 2021-12-07 ENCOUNTER — Other Ambulatory Visit (INDEPENDENT_AMBULATORY_CARE_PROVIDER_SITE_OTHER): Payer: Self-pay | Admitting: Gastroenterology

## 2021-12-21 DIAGNOSIS — C4490 Unspecified malignant neoplasm of skin, unspecified: Secondary | ICD-10-CM

## 2021-12-21 HISTORY — PX: OTHER SURGICAL HISTORY: SHX169

## 2021-12-21 HISTORY — DX: Unspecified malignant neoplasm of skin, unspecified: C44.90

## 2022-01-30 ENCOUNTER — Ambulatory Visit (INDEPENDENT_AMBULATORY_CARE_PROVIDER_SITE_OTHER): Payer: 59 | Admitting: Gastroenterology

## 2022-01-30 ENCOUNTER — Encounter (INDEPENDENT_AMBULATORY_CARE_PROVIDER_SITE_OTHER): Payer: Self-pay | Admitting: Gastroenterology

## 2022-01-30 VITALS — BP 108/60 | HR 75 | Temp 98.0°F | Ht 65.5 in | Wt 199.1 lb

## 2022-01-30 DIAGNOSIS — K2270 Barrett's esophagus without dysplasia: Secondary | ICD-10-CM

## 2022-01-30 DIAGNOSIS — K219 Gastro-esophageal reflux disease without esophagitis: Secondary | ICD-10-CM | POA: Diagnosis not present

## 2022-01-30 NOTE — Progress Notes (Addendum)
Referring Provider: Celene Squibb, MD Primary Care Physician:  Celene Squibb, MD Primary GI Physician: Jenetta Downer   Chief Complaint  Patient presents with   Gastroesophageal Reflux    Follow up for refills for GERD meds. Doing better since losing weight. Does take protonix in the mornings and pepcid at night.    HPI:   Sarah Avila is a 61 y.o. female with past medical history of dermatofibrosarcoma, GERD complicated by nondysplastic Barrett's esophagus, hiatal hernia   Patient presenting today for follow up of GERD/Barrett's esophagus .  Lat seen July 2022, having some RLQ pain at that time. Occaisionally having to strain to defecate. Taking protonix '40mg'$  in the morning and tums every night.   Recommended to continue protonix '40mg'$  daily, start famotidine '40mg'$  QHS, stop tums, referral to nutrition, schedule abdominal US, start miralax 1 capful daily.  Abd [place] was normal.   Present: Patient states that GERD is very well controlled. She has little to no breakthrough symptoms. She is taking protonix in the morning and taking famotidine at night. Previously having more symptoms at night but this is very seldom now with addition of famotidine. She occasionally will take a few tums or gaviscon if she eats something like cucumbers, otherwise she does well without symptoms. Denies dysphagia or odynophagia. Has changed her diet and had some weight loss avoiding fats, breads. She has lost around 40 pounds doing this. No nausea vomiting, rectal bleeding or melena. Constipation has improved since last visit. She is not taking anything for this, she thinks diet has helped with bowel habits.    Last EGD:11/2019 The hypopharynx was normal. The proximal esophagus and mid esophagus were normal. There were esophageal mucosal changes secondary to established long-segment Barrett's disease present in the distal esophagus. The maximum longitudinal extent of these mucosal changes was 4 cm in length. Mucosa  was biopsied with a cold forceps for histology randomly proximal and distal areas. A total of 2 specimen bottles were sent to pathology. Presence of BE but no dysplasia The Z-line was irregular and was found 35 cm from the incisors. A 3 cm hiatal hernia was present. The entire examined stomach was normal. The duodenal bulb and second portion of the duodenum were normal. Last Colonoscopy: 11/2019 - a 6 mm removed from cecum - TA.    She was advised to repeat in 5 years.  Past Medical History:  Diagnosis Date   Dermatofibrosarcoma protuberans    right dorsal foot tumor   Fibrocystic breast    GERD (gastroesophageal reflux disease)    Hiatal hernia    History of esophageal stricture    w/ dilatation 01-/ 2018   History of HPV infection    Osteopenia after menopause 2019   Psoriasis    back of scalp   Wears glasses     Past Surgical History:  Procedure Laterality Date   BIOPSY  03/22/2016   Procedure: BIOPSY;  Surgeon: Rogene Houston, MD;  [facility];  Service: Endoscopy;;  esophagus   BIOPSY N/A 09/01/2016   Procedure: BIOPSY;  Surgeon: Rogene Houston, MD;  [facility];  Service: Endoscopy;  Laterality: N/A;  esophageal   BIOPSY  12/04/2019   Procedure: BIOPSY;  Surgeon: Rogene Houston, MD;  [facility];  Service: Endoscopy;;   CHOLECYSTECTOMY OPEN  1980   and Appendectomy   COLONOSCOPY N/A 12/04/2019   Procedure: COLONOSCOPY;  Surgeon: Rogene Houston, MD;  [facility];  Service: Endoscopy;  Laterality: N/A;  145   D & C HYSTEROSCOPY W/ ENDOMETRIAL ABLATION  06/30/2005   ESOPHAGEAL DILATION N/A 03/22/2016   Procedure: ESOPHAGEAL DILATION;  Surgeon: Rogene Houston, MD;  [facility];  Service: Endoscopy;  Laterality: N/A;   ESOPHAGOGASTRODUODENOSCOPY N/A 03/22/2016   Procedure: ESOPHAGOGASTRODUODENOSCOPY (EGD);  Surgeon: Rogene Houston, MD;  [facility];  Service: Endoscopy;  Laterality: N/A;  1:45    ESOPHAGOGASTRODUODENOSCOPY N/A 09/01/2016   Procedure: ESOPHAGOGASTRODUODENOSCOPY (EGD);  Surgeon: Rogene Houston, MD;  [facility];  Service: Endoscopy;  Laterality: N/A;  1230   ESOPHAGOGASTRODUODENOSCOPY N/A 12/04/2019   Procedure: ESOPHAGOGASTRODUODENOSCOPY (EGD);  Surgeon: Rogene Houston, MD;  [facility];  Service: Endoscopy;  Laterality: N/A;   EXCISION LESION FOOT Right 07/04/2016   Procedure: WIDE LOCAL EXCISION OF RIGHT DORSAL FOOT;  Surgeon: Hall Busing, MD;  Location: [place];  Service: General;  Laterality: Right;   EXCISION OF BREAST BIOPSY Left 03/07/1999   w/ needle localization (benign)   SHOULDER ARTHROSCOPY W/ ROTATOR CUFF REPAIR Right 2003  approx.   TUBAL LIGATION Bilateral yrs ago    Current Outpatient Medications  Medication Sig Dispense Refill   pantoprazole (PROTONIX) 40 MG tablet TAKE 1 TABLET BY MOUTH EVERY DAY 90 tablet 0   dicyclomine (BENTYL) 10 MG capsule Take 1 capsule (10 mg total) by mouth every 12 (twelve) hours as needed (abdominal pain). (Patient not taking: Reported on 01/30/2022) 180 capsule 1   famotidine (PEPCID) 40 MG tablet Take 1 tablet (40 mg total) by mouth at bedtime. (Patient not taking: Reported on 01/30/2022) 90 tablet 3   No current facility-administered medications for this visit.    Allergies as of 01/30/2022   (No Known Allergies)    Family History  Problem Relation Age of Onset   Hypertension Mother    Osteoporosis Mother    Cancer Father        prostate cancer    Social History   Socioeconomic History   Marital status: Divorced    Spouse name: Not on file   Number of children: Not on file   Years of education: Not on file   Highest education level: Not on file  Occupational History   Occupation: HR  Tobacco Use   Smoking status: Every Day    Packs/day: 0.00    Years: 20.00    Total pack years: 0.00    Types: Cigarettes    Passive exposure: Current   Smokeless  tobacco: Never   Tobacco comments:    1 cigarettes /day.   Vaping Use   Vaping Use: Never used  Substance and Sexual Activity   Alcohol use: Yes    Comment: 1-2 mixed drinks a week   Drug use: No   Sexual activity: Yes    Birth control/protection: Surgical, Post-menopausal    Comment: ablation and tubal  Other Topics Concern   Not on file  Social History Narrative   Not on file   Social Determinants of Health   Financial Resource Strain: Unknown (07/29/2020)   Overall Financial Resource Strain (CARDIA)    Difficulty of Paying Living Expenses: Patient refused  Food Insecurity: No Food Insecurity (07/29/2020)   Hunger Vital Sign    Worried About Running Out of Food in the Last Year: Never true    Ran Out of Food in the Last Year: Never true  Transportation Needs: No Transportation Needs (07/29/2020)   PRAPARE - Transportation  Lack of Transportation (Medical): No    Lack of Transportation (Non-Medical): No  Physical Activity: Insufficiently Active (07/29/2020)   Exercise Vital Sign    Days of Exercise per Week: 1 day    Minutes of Exercise per Session: 20 min  Stress: No Stress Concern Present (07/29/2020)   [place]    Feeling of Stress : Only a little  Social Connections: Socially Isolated (07/29/2020)   Social Connection and Isolation Panel [NHANES]    Frequency of Communication with Friends and Family: More than three times a week    Frequency of Social Gatherings with Friends and Family: Once a week    Attends Religious Services: Never    Marine scientist or Organizations: No    Attends Music therapist: Never    Marital Status: Divorced   Review of systems General: negative for malaise, night sweats, fever, chills, weight loss Neck: Negative for lumps, goiter, pain and significant neck swelling Resp: Negative for cough, wheezing, dyspnea at rest CV: Negative for chest pain, leg swelling,  palpitations, orthopnea GI: denies melena, hematochezia, nausea, vomiting, diarrhea, constipation, dysphagia, odyonophagia, early satiety or unintentional weight loss.  MSK: Negative for joint pain or swelling, back pain, and muscle pain. Derm: Negative for itching or rash Psych: Denies depression, anxiety, memory loss, confusion. No homicidal or suicidal ideation.  Heme: Negative for prolonged bleeding, bruising easily, and swollen nodes. Endocrine: Negative for cold or heat intolerance, polyuria, polydipsia and goiter. Neuro: negative for tremor, gait imbalance, syncope and seizures. The remainder of the review of systems is noncontributory.  Physical Exam: BP 108/60 (BP Location: Left Arm, Patient Position: Sitting, Cuff Size: Large)   Pulse 75   Temp 98 F (36.7 C) (Oral)   Ht 5' 5.5" (1.664 m)   Wt 199 lb 1.6 oz (90.3 kg)   BMI 32.63 kg/m  General:   Alert and oriented. No distress noted. Pleasant and cooperative.  Head:  Normocephalic and atraumatic. Eyes:  Conjuctiva clear without scleral icterus. Mouth:  Oral mucosa pink and moist. Good dentition. No lesions. Heart: Normal rate and rhythm, s1 and s2 heart sounds present.  Lungs: Clear lung sounds in all lobes. Respirations equal and unlabored. Abdomen:  +BS, soft, non-tender and non-distended. No rebound or guarding. No HSM or masses noted. Derm: No palmar erythema or jaundice Msk:  Symmetrical without gross deformities. Normal posture. Extremities:  Without edema. Neurologic:  Alert and  oriented x4 Psych:  Alert and cooperative. Normal mood and affect.  Invalid input(s): "6 MONTHS"   ASSESSMENT: Sarah Avila is a 61 y.o. female presenting today for follow up of GERD and Barrett's esophagus.  GERD/Barrett's esophagus: GERD well controlled on Protonix '40mg'$  daily and famotidine '40mg'$  QHS. She has rare breakthrough symptoms unless she eats something like cucumbers which she will take gaviscon or tums for but this is not  frequent. No red flag symptoms. Patient denies melena, hematochezia, nausea, vomiting, diarrhea, constipation, dysphagia, odyonophagia, early satiety or weight loss.   Will continue with current regimen of PPI daily and H2B in the evenings, plan to repeat EGD in 2024 and Colonoscopy in 2026 unless new GI symptoms arise.    PLAN:  Continue protonix '40mg'$  daily  2. Continue famotidine '40mg'$  QHS  3. Continue with reflux precautions and dietary changes  All questions were answered, patient verbalized understanding and is in agreement with plan as outlined above.   Follow Up: 1 year  Deniro Laymon L.  Alver Sorrow, MSN, APRN, AGNP-C Adult-Gerontology Nurse Practitioner [facility]  I have reviewed the note and agree with the APP's assessment as described in this progress note  Will recommend updating the repeat EGD timeframe to 2024 given the presence of long segment BE.  Maylon Peppers, MD Gastroenterology and Hepatology [facility]

## 2022-01-30 NOTE — Patient Instructions (Signed)
Please continue to take protonix in the morning and famotidine '40mg'$  in the evenings Avoid greasy, spicy, fried, citrus foods, and be mindful that caffeine, carbonated drinks, chocolate and alcohol can increase reflux symptoms Stay upright 2-3 hours after eating, prior to lying down and avoid eating late in the evenings.  Follow up 1 year

## 2022-01-30 NOTE — Addendum Note (Signed)
Addended by: Harvel Quale on: 01/30/2022 08:31 PM   Modules accepted: Level of Service

## 2022-01-31 ENCOUNTER — Telehealth (INDEPENDENT_AMBULATORY_CARE_PROVIDER_SITE_OTHER): Payer: Self-pay | Admitting: *Deleted

## 2022-01-31 NOTE — Telephone Encounter (Signed)
-----   Message from Harvel Quale, MD sent at 01/30/2022  8:32 PM EST ----- Zada Girt, can you please update the recall for EGD for 12/2022? Dx: long segment Barrett's esophagus  Thanks,   Maylon Peppers, MD Gastroenterology and Hepatology [facility]astroenterology

## 2022-01-31 NOTE — Telephone Encounter (Signed)
EGD recall updated to 12/2022

## 2022-03-10 ENCOUNTER — Other Ambulatory Visit (INDEPENDENT_AMBULATORY_CARE_PROVIDER_SITE_OTHER): Payer: Self-pay | Admitting: Gastroenterology

## 2022-07-03 IMAGING — US US PELVIS COMPLETE WITH TRANSVAGINAL
1 series · 14 of 25 positions shown · non-contrast
Comparison: 04/10/2008

CLINICAL DATA: Pelvic pain, postmenopausal



[Series 1: us pelvis complete with transvaginal · 0.27mm/px · 14 of 75 slices shown]
[im 1/75]
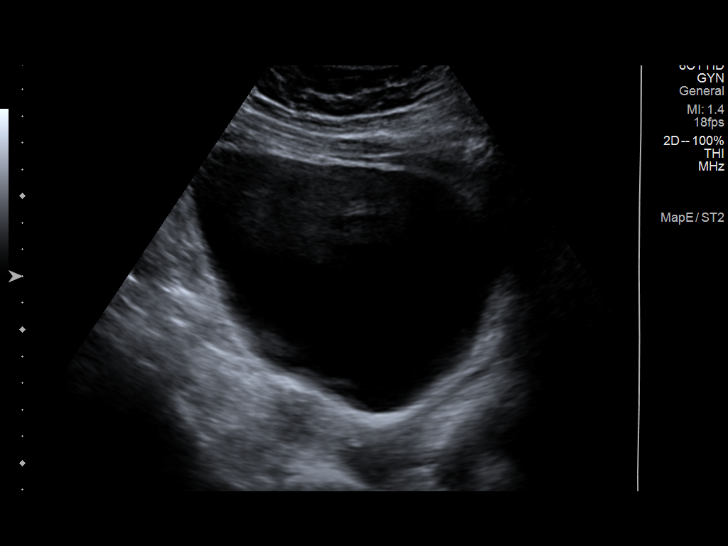
[im 7/75]
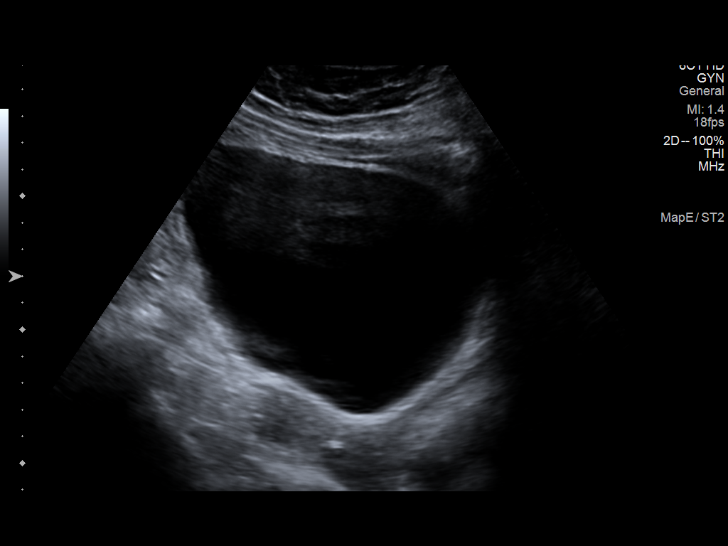
[im 13/75]
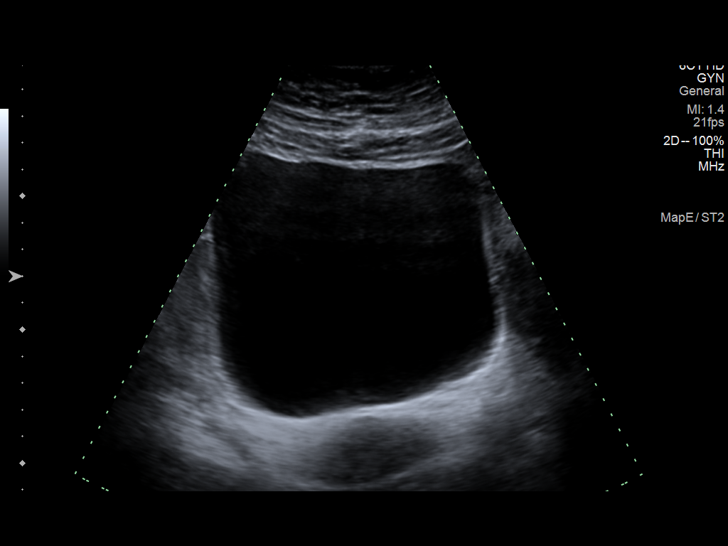
[im 19/75]
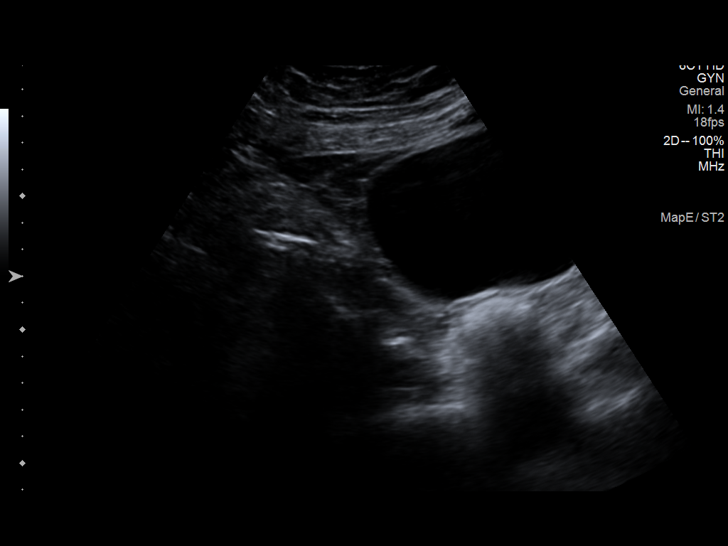
[im 25/75]
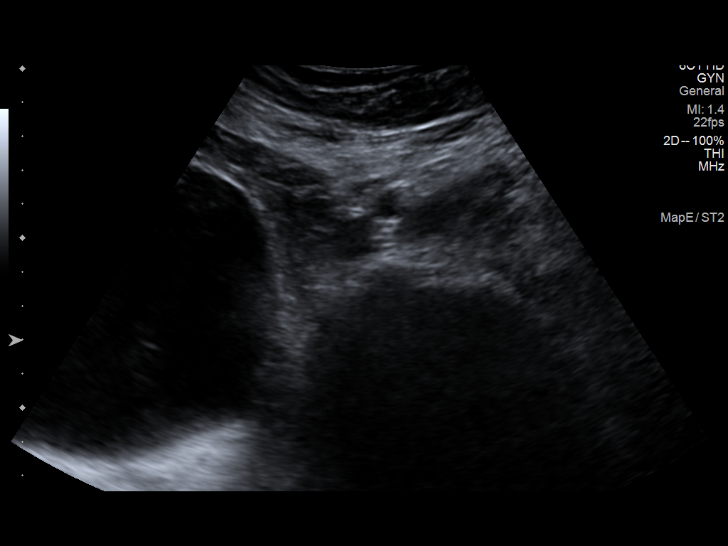
[im 28/75]
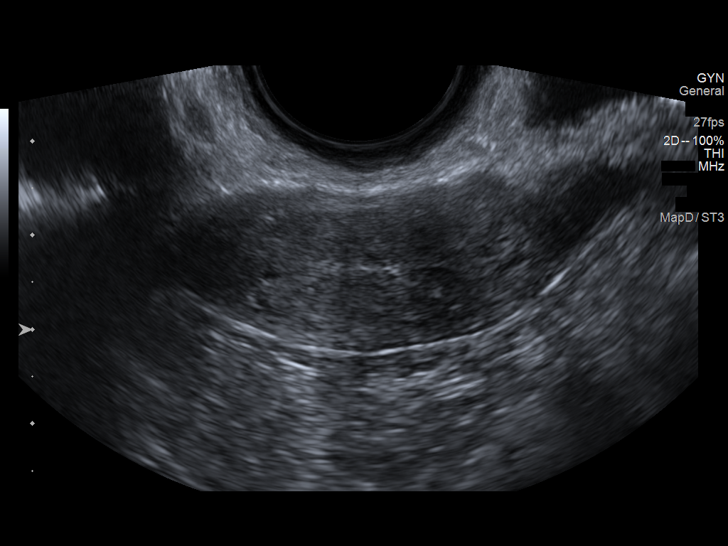
[im 34/75]
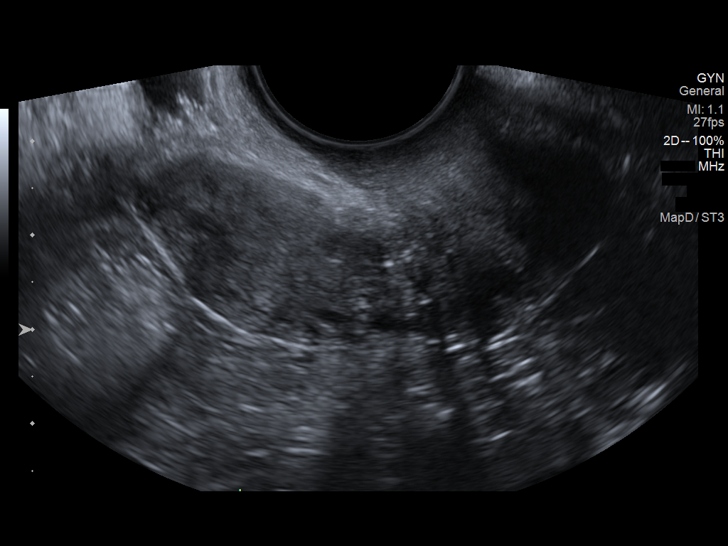
[im 41/75]
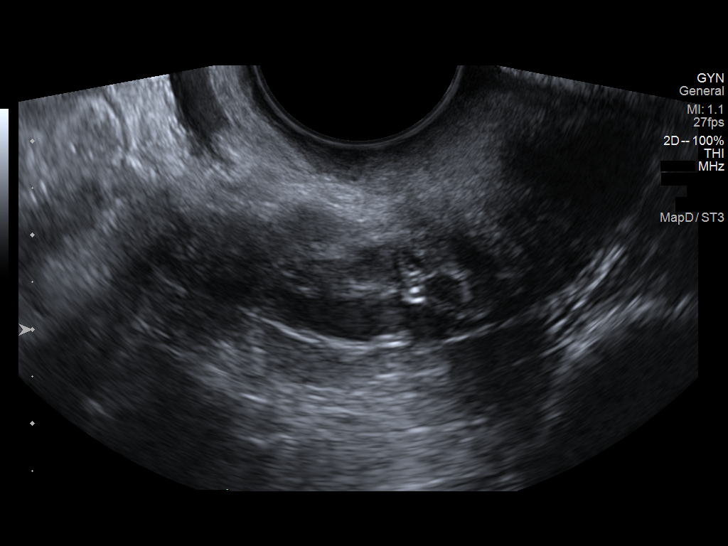
[im 47/75]
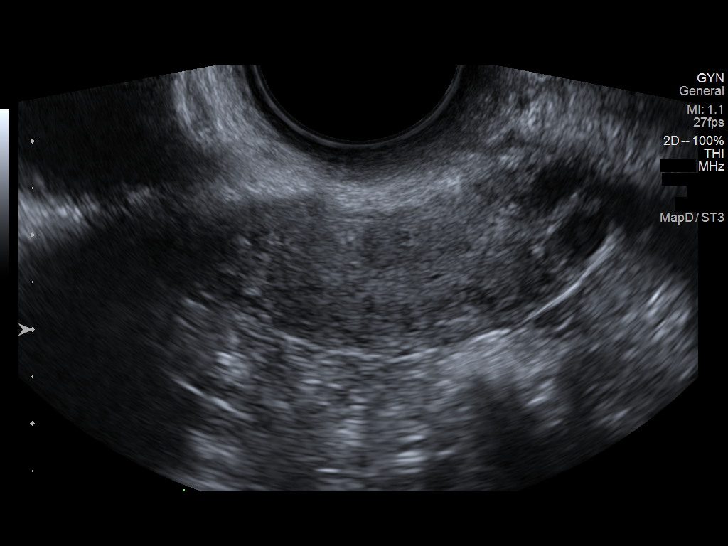
[im 50/75]
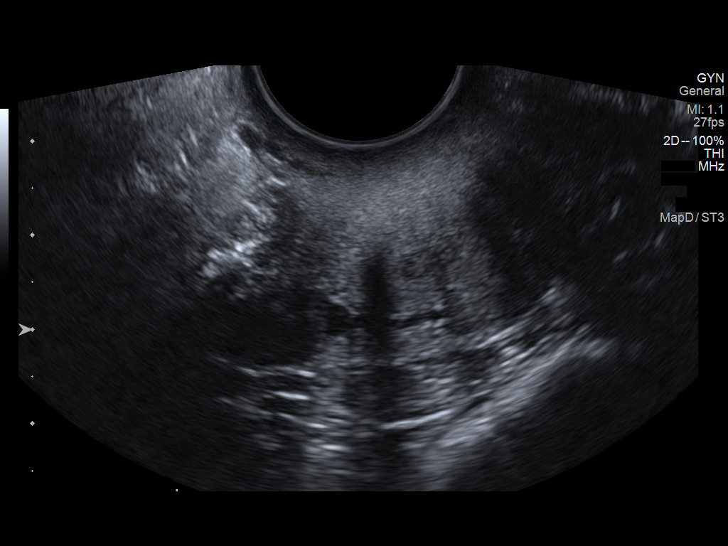
[im 56/75]
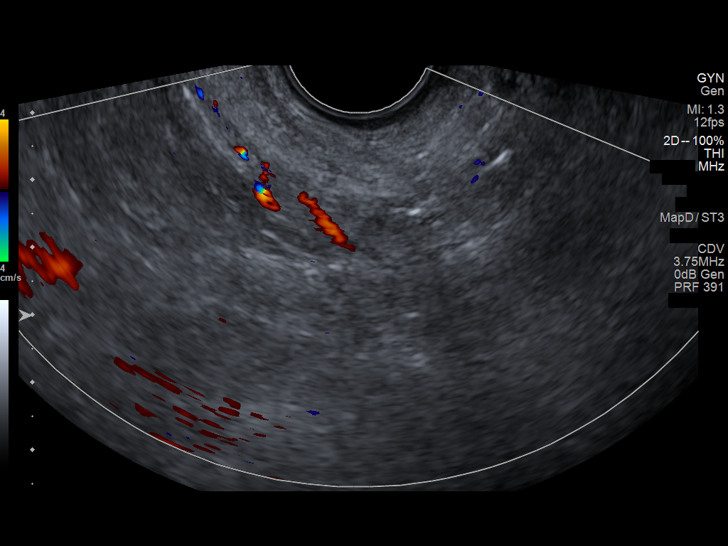
[im 62/75]
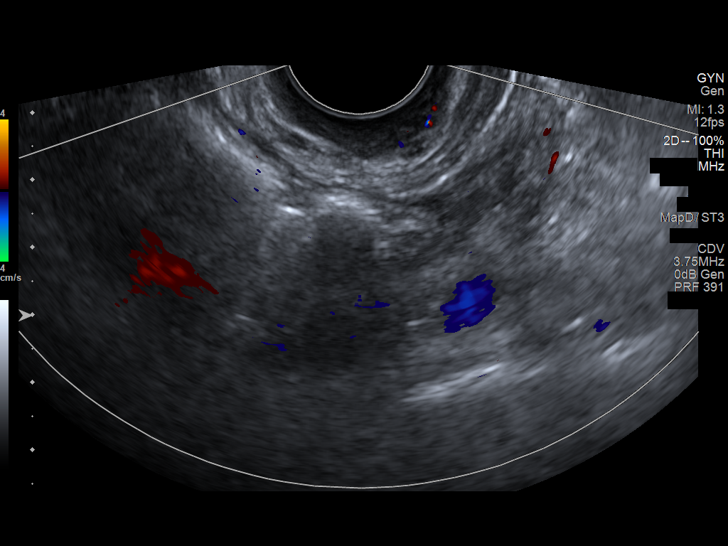
[im 68/75]
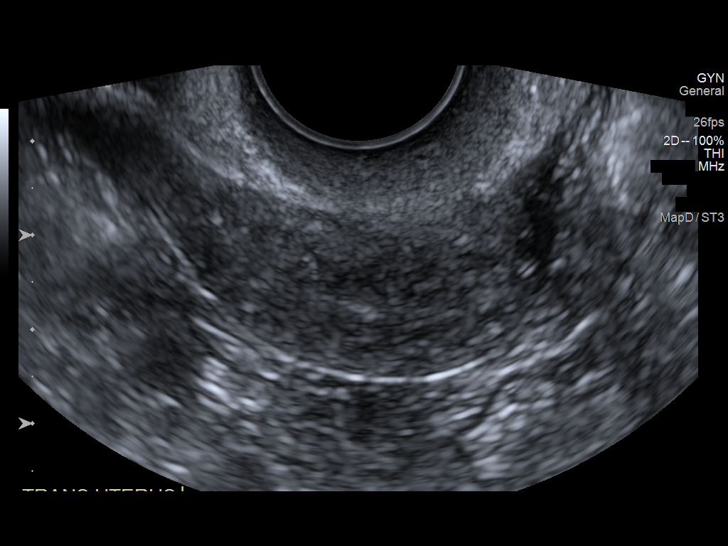
[im 75/75]
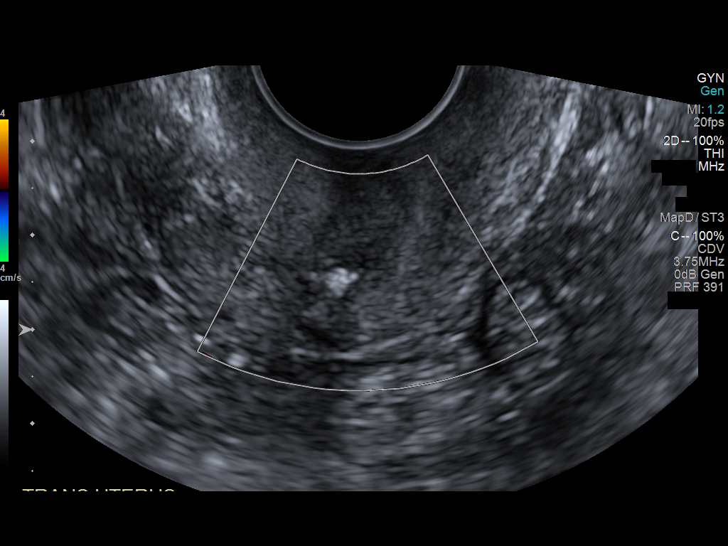

[14 of 25 positions shown; findings below may reference images not displayed]

FINDINGS: Uterus

Measurements: 4.1 x 2.0 x 3.6 cm = volume: 15 mL. Atrophic.
Anteverted. Normal morphology without mass

Endometrium

Thickness: 3 mm.  No endometrial fluid or focal abnormality

Right ovary

Not visualized, likely obscured by bowel

Left ovary

Not visualized, likely obscured by bowel

Other findings

No free pelvic fluid.  No adnexal masses.
IMPRESSION: Unremarkable uterus and endometrial complex.

Nonvisualization of ovaries.

No acute abnormalities.

## 2022-08-25 ENCOUNTER — Other Ambulatory Visit (HOSPITAL_COMMUNITY): Payer: Self-pay | Admitting: Adult Health

## 2022-08-25 DIAGNOSIS — Z1231 Encounter for screening mammogram for malignant neoplasm of breast: Secondary | ICD-10-CM

## 2022-08-28 ENCOUNTER — Encounter: Payer: Self-pay | Admitting: Adult Health

## 2022-08-28 ENCOUNTER — Ambulatory Visit (INDEPENDENT_AMBULATORY_CARE_PROVIDER_SITE_OTHER): Payer: 59 | Admitting: Adult Health

## 2022-08-28 ENCOUNTER — Other Ambulatory Visit (HOSPITAL_COMMUNITY)
Admission: RE | Admit: 2022-08-28 | Discharge: 2022-08-28 | Disposition: A | Discharge status: Home / Self Care | Payer: 59 | Source: Ambulatory Visit | Attending: Adult Health | Admitting: Adult Health

## 2022-08-28 VITALS — BP 116/73 | HR 73 | Ht 65.0 in | Wt 225.5 lb

## 2022-08-28 DIAGNOSIS — Z01419 Encounter for gynecological examination (general) (routine) without abnormal findings: Secondary | ICD-10-CM

## 2022-08-28 DIAGNOSIS — Z1211 Encounter for screening for malignant neoplasm of colon: Secondary | ICD-10-CM | POA: Diagnosis not present

## 2022-08-28 DIAGNOSIS — Z780 Asymptomatic menopausal state: Secondary | ICD-10-CM | POA: Diagnosis not present

## 2022-08-28 DIAGNOSIS — M8580 Other specified disorders of bone density and structure, unspecified site: Secondary | ICD-10-CM

## 2022-08-28 LAB — HEMOCCULT GUIAC POC 1CARD (OFFICE): Fecal Occult Blood, POC: NEGATIVE

## 2022-08-28 NOTE — Progress Notes (Signed)
Patient ID: Sarah Avila, female   DOB: 03-May-1960, 62 y.o.   MRN: [id number] History of Present Illness: Sarah Avila is a 62 year old white female, divorced, PM in for a well woman gyn exam and pap.  PCP is Dr Margo Aye.   Current Medications, Allergies, Past Medical History, Past Surgical History, Family History and Social History were reviewed in [employer] record.     Review of Systems: Patient denies any headaches, hearing loss, fatigue, blurred vision, shortness of breath, chest pain, abdominal pain, problems with bowel movements, urination, or intercourse. No joint pain or mood swings.  She denies any vaginal bleeding    Physical Exam:BP 116/73 (BP Location: Left Arm, Patient Position: Sitting, Cuff Size: Large)   Pulse 73   Ht 5\' 5"  (1.651 m)   Wt 225 lb 8 oz (102.3 kg)   BMI 37.53 kg/m   General:  Well developed, well nourished, no acute distress Skin:  Warm and dry Neck:  Midline trachea, normal thyroid, good ROM, no lymphadenopathy,no carotid bruits heard Lungs; Clear to auscultation bilaterally Breast:  No dominant palpable mass, retraction, or nipple discharge Cardiovascular: Regular rate and rhythm Abdomen:  Soft, non tender, no hepatosplenomegaly Pelvic:  External genitalia is normal in appearance, no lesions.  The vagina is pale. Urethra has no lesions or masses. The cervix is smooth, pap with HR HPV genotyping performed.  Uterus is felt to be normal size, shape, and contour.  No adnexal masses or tenderness noted.Bladder is non tender, no masses felt. Rectal: Good sphincter tone, no polyps, or hemorrhoids felt.  Hemoccult negative. Extremities/musculoskeletal:  No swelling or varicosities noted, no clubbing or cyanosis Psych:  No mood changes, alert and cooperative,seems happy AA is 2 Fall risk is low    08/28/2022    1:37 PM 07/29/2020    9:31 AM 05/08/2019    8:32 AM  Depression screen PHQ 2/9  Decreased Interest 0 0 0  Down, Depressed, Hopeless 0  0 0  PHQ - 2 Score 0 0 0  Altered sleeping 1 0   Tired, decreased energy 1 1   Change in appetite 3 1   Feeling bad or failure about yourself  1 0   Trouble concentrating 0 0   Moving slowly or fidgety/restless 0 0   Suicidal thoughts 0 0   PHQ-9 Score 6 2        08/28/2022    1:37 PM 07/29/2020    9:31 AM  GAD 7 : Generalized Anxiety Score  Nervous, Anxious, on Edge 1 1  Control/stop worrying 0 0  Worry too much - different things 0 0  Trouble relaxing 1 0  Restless 0 0  Easily annoyed or irritable 0 0  Afraid - awful might happen 0 0  Total GAD 7 Score 2 1    Upstream - 08/28/22 1344       Pregnancy Intention Screening   Does the patient want to become pregnant in the next year? N/A    Does the patient's partner want to become pregnant in the next year? N/A    Would the patient like to discuss contraceptive options today? N/A      Contraception Wrap Up   Current Method Female Sterilization    End Method Female Sterilization    Contraception Counseling Provided No            Examination chaperoned by Malachy Mood LPN     Impression and Plan: 1. Encounter for gynecological examination with  Papanicolaou smear of cervix Pap sent Pap in in 3 years if normal Physical in 1 year Mammogram was negative 09/05/21 Colonoscopy per [facility] with PCP - Cytology - PAP( [place])  2. Encounter for screening fecal occult blood testing Hemoccult was negative   3. Postmenopause Had osteopenia in 2019 Will get DEXA 09/08/22 at [facility] at [facility]  - DG Bone Density; Future  4. Osteopenia determined by x-ray Osteopenia in 2019 on DEXA DEX scheduled for 09/08/22 at [facility] at [facility] Bone Density; Future

## 2022-08-30 LAB — CYTOLOGY - PAP
Comment: NEGATIVE
Diagnosis: NEGATIVE
High risk HPV: NEGATIVE

## 2022-09-07 IMAGING — MG MM DIGITAL SCREENING BILAT W/ TOMO AND CAD
6 of 12 series · 6 of 36 positions shown · non-contrast
Comparison: Previous exam(s).

CLINICAL DATA: Screening.

EXAM:
DIGITAL SCREENING BILATERAL MAMMOGRAM WITH TOMOSYNTHESIS AND CAD
TECHNIQUE: Bilateral screening digital craniocaudal and mediolateral oblique
mammograms were obtained. Bilateral screening digital breast
tomosynthesis was performed. The images were evaluated with
computer-aided detection.

[L CC synth-2D (1 of 2)]
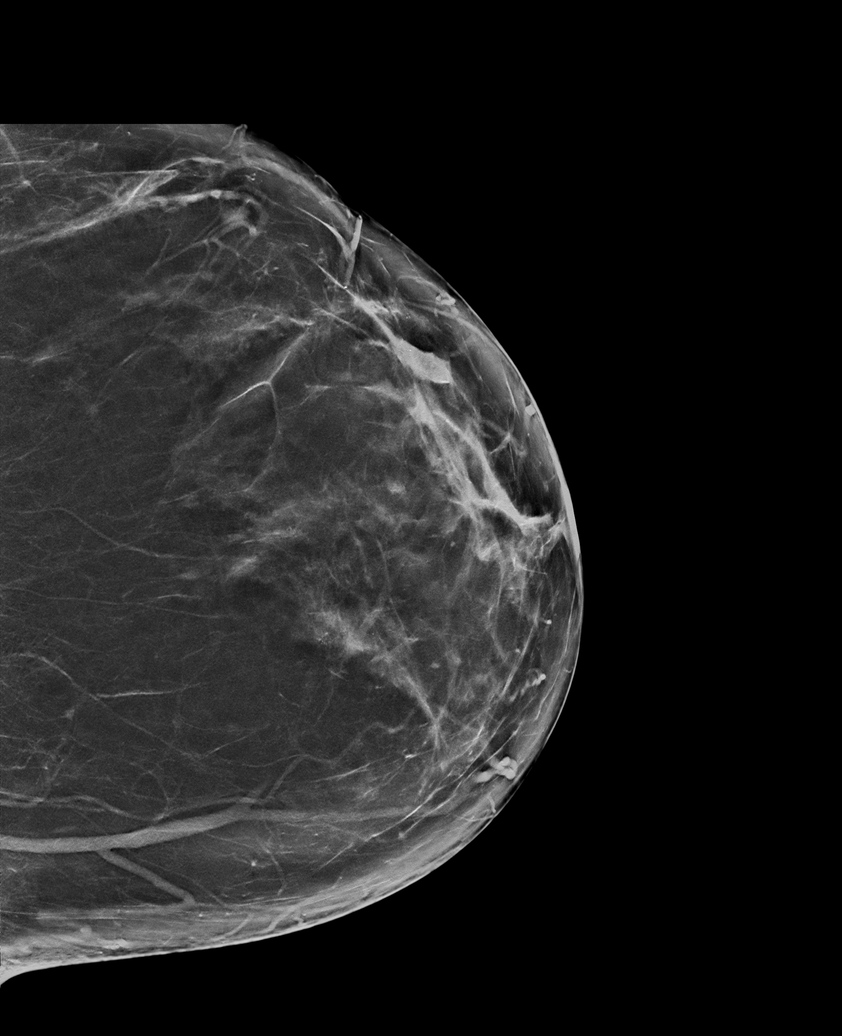

[R MLO synth-2D (1 of 2)]
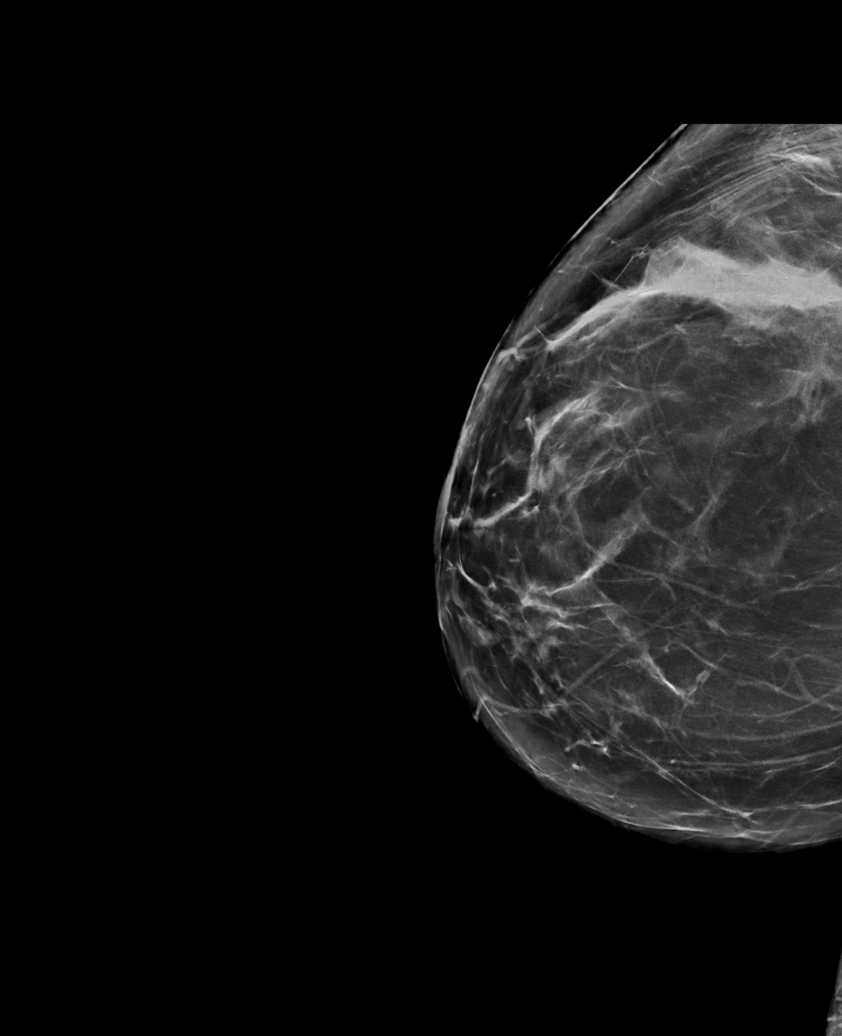

[L MLO synth-2D]
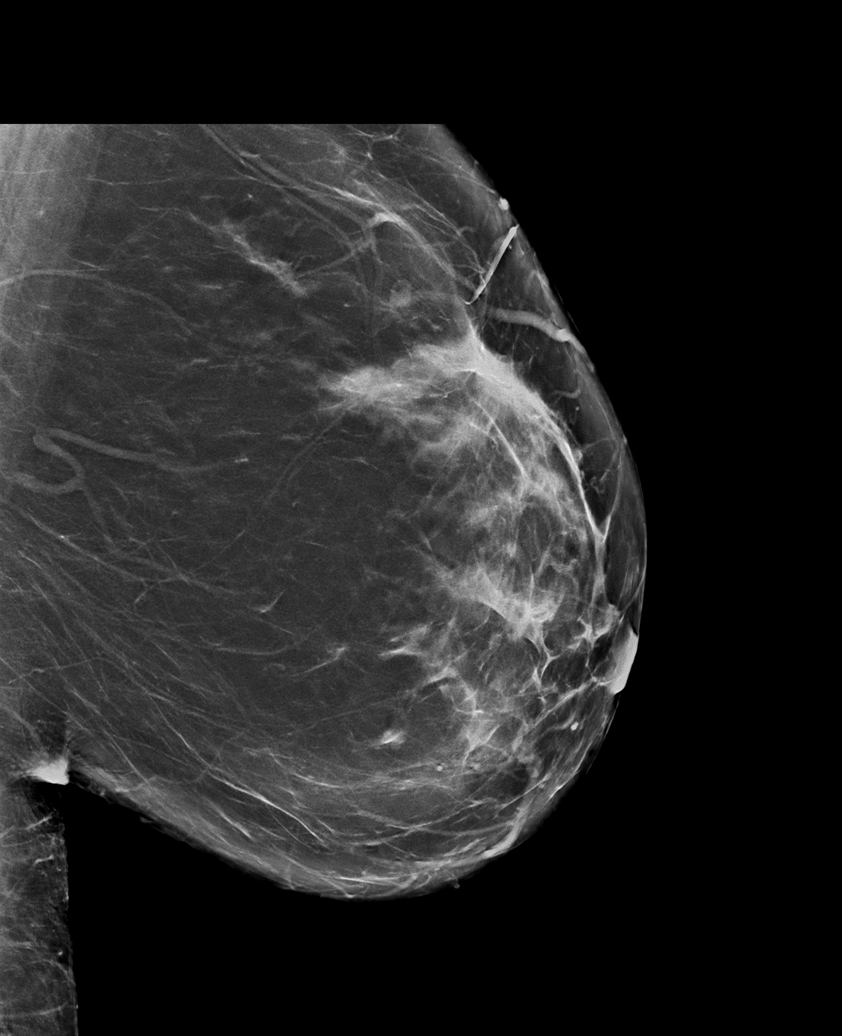

[R CC synth-2D]
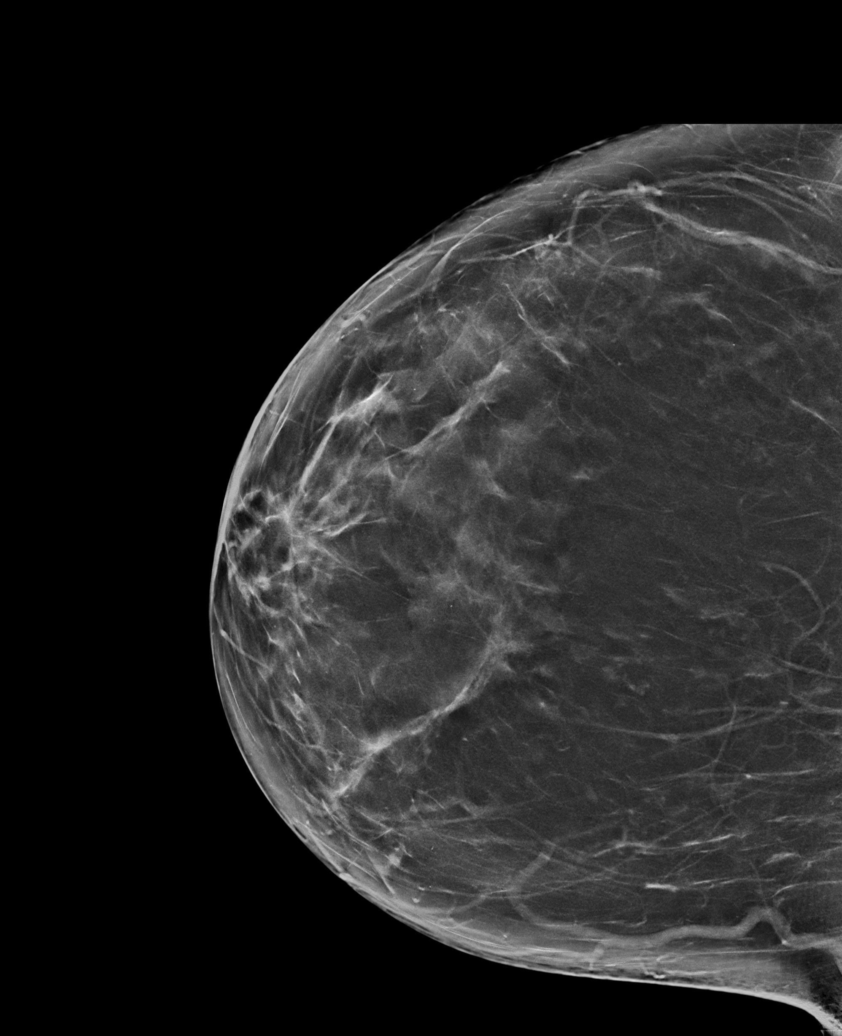

[R MLO synth-2D (2 of 2)]
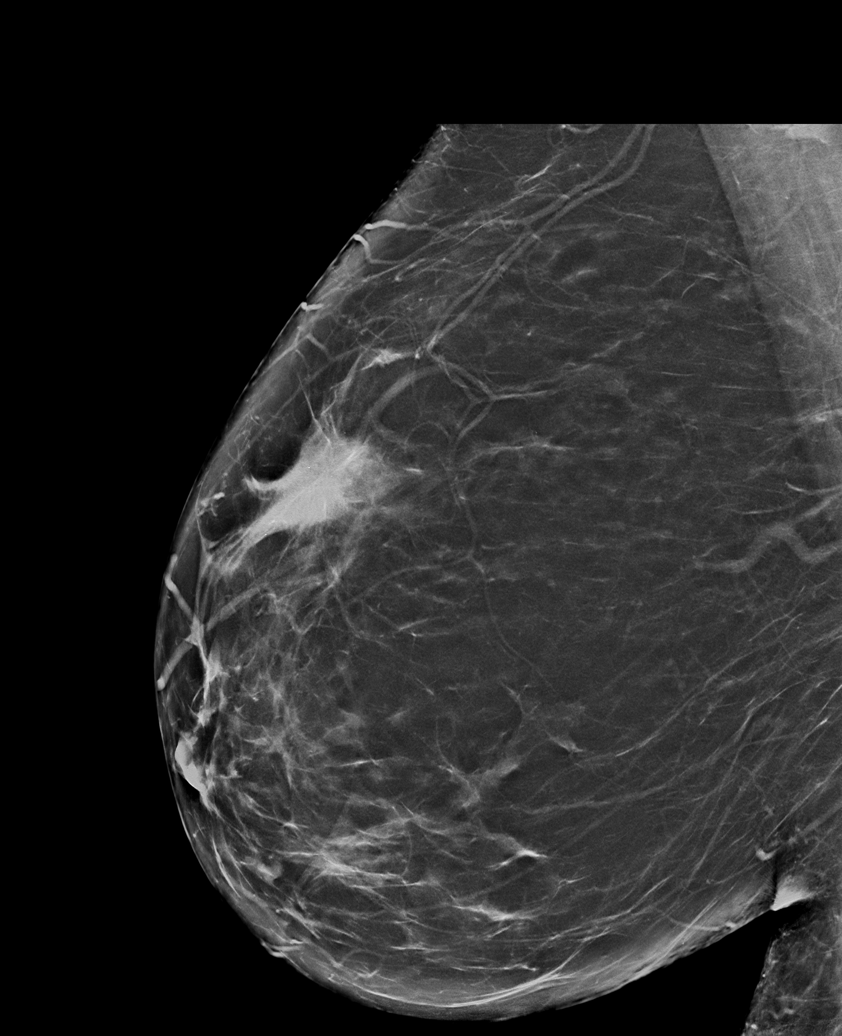

[L CC synth-2D (2 of 2)]
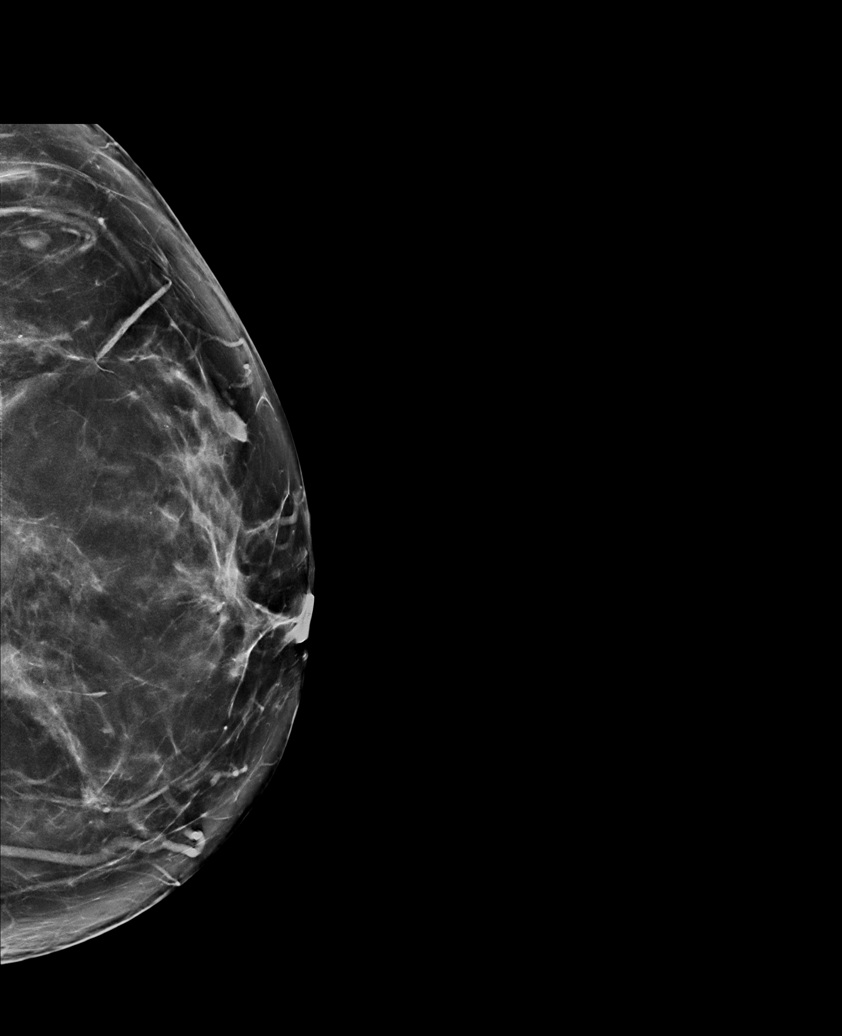

[6 of 36 positions shown; findings below may reference images not displayed]

ACR Breast Density Category b: There are scattered areas of
fibroglandular density.
FINDINGS: There are no findings suspicious for malignancy.
IMPRESSION: No mammographic evidence of malignancy. A result letter of this
screening mammogram will be mailed directly to the patient.

RECOMMENDATION:
Screening mammogram in one year. (Code:51-O-LD2)

BI-RADS CATEGORY  1: Negative.

## 2022-09-08 ENCOUNTER — Encounter (HOSPITAL_COMMUNITY): Payer: Self-pay

## 2022-09-08 ENCOUNTER — Ambulatory Visit (HOSPITAL_COMMUNITY)
Admission: RE | Admit: 2022-09-08 | Discharge: 2022-09-08 | Disposition: A | Discharge status: Home / Self Care | Payer: 59 | Source: Ambulatory Visit | Attending: Adult Health | Admitting: Adult Health

## 2022-09-08 DIAGNOSIS — Z780 Asymptomatic menopausal state: Secondary | ICD-10-CM | POA: Insufficient documentation

## 2022-09-08 DIAGNOSIS — M8580 Other specified disorders of bone density and structure, unspecified site: Secondary | ICD-10-CM | POA: Insufficient documentation

## 2022-09-08 DIAGNOSIS — Z1231 Encounter for screening mammogram for malignant neoplasm of breast: Secondary | ICD-10-CM | POA: Diagnosis present

## 2022-09-13 ENCOUNTER — Other Ambulatory Visit (INDEPENDENT_AMBULATORY_CARE_PROVIDER_SITE_OTHER): Payer: Self-pay | Admitting: Gastroenterology

## 2023-01-26 ENCOUNTER — Other Ambulatory Visit (HOSPITAL_COMMUNITY): Payer: Self-pay | Admitting: Family Medicine

## 2023-01-26 DIAGNOSIS — F17200 Nicotine dependence, unspecified, uncomplicated: Secondary | ICD-10-CM

## 2023-02-01 ENCOUNTER — Ambulatory Visit (INDEPENDENT_AMBULATORY_CARE_PROVIDER_SITE_OTHER): Payer: 59 | Admitting: Gastroenterology

## 2023-02-03 ENCOUNTER — Ambulatory Visit (HOSPITAL_COMMUNITY)
Admission: RE | Admit: 2023-02-03 | Discharge: 2023-02-03 | Disposition: A | Discharge status: Home / Self Care | Payer: 59 | Source: Ambulatory Visit | Attending: Family Medicine | Admitting: Family Medicine

## 2023-02-03 DIAGNOSIS — F17200 Nicotine dependence, unspecified, uncomplicated: Secondary | ICD-10-CM | POA: Diagnosis present

## 2023-02-23 ENCOUNTER — Ambulatory Visit (INDEPENDENT_AMBULATORY_CARE_PROVIDER_SITE_OTHER): Payer: 59 | Admitting: Gastroenterology

## 2023-02-23 ENCOUNTER — Encounter (INDEPENDENT_AMBULATORY_CARE_PROVIDER_SITE_OTHER): Payer: Self-pay | Admitting: Gastroenterology

## 2023-02-23 ENCOUNTER — Other Ambulatory Visit (INDEPENDENT_AMBULATORY_CARE_PROVIDER_SITE_OTHER): Payer: Self-pay | Admitting: Gastroenterology

## 2023-02-23 DIAGNOSIS — R140 Abdominal distension (gaseous): Secondary | ICD-10-CM

## 2023-02-23 DIAGNOSIS — K219 Gastro-esophageal reflux disease without esophagitis: Secondary | ICD-10-CM

## 2023-02-23 DIAGNOSIS — K2270 Barrett's esophagus without dysplasia: Secondary | ICD-10-CM

## 2023-02-23 MED ORDER — FAMOTIDINE 40 MG PO TABS
40.0000 mg | ORAL_TABLET | Freq: Every evening | ORAL | 3 refills | Status: DC
Start: 2023-02-23 — End: 2023-02-26

## 2023-02-23 NOTE — Patient Instructions (Signed)
 Continue Protonix 40 mg daily and famotidine 40 mg at night. Schedule EGD

## 2023-02-23 NOTE — Progress Notes (Signed)
 Sarah Avila, M.D. Gastroenterology & Hepatology [facility] Gastroenterology [address] [place], [place] [address]  Primary Care Physician: Benita Stabile, MD [address] [place] [address]  I will communicate my assessment and recommendations to the referring MD via EMR.  Problems: Long segment Barrett's esophagus GERD  History of Present Illness: Sarah Avila is a 63 y.o. female with past medical history of dermatofibrosarcoma, GERD complicated by nondysplastic Barrett's esophagus, hiatal hernia, who presents for follow up of GERD, Barrett's esophagus and bloating.  The patient was last seen on 01/30/2022. At that time, the patient was continued on Protonix 40 mg daily and famotidine 40 mg at night.  Patient reports that as long as she takes the Protonix 40 mg and famotidine and 40 mg at night, she will not have heartburn. No dysphagia or odynophagia.    She states having some "gas pain" in both sides of her abdomen for the last couple of weeks. States that she has been eating differently recently as she lost her partner recently.  States that she has been eating more than usual due to these.  Prior to this event, she was not having the gas pain episodes.  The patient denies having any nausea, vomiting, fever, chills, hematochezia, melena, hematemesis, abdominal distention, abdominal pain, diarrhea, jaundice, pruritus or weight loss.  Last EGD:11/2019 The hypopharynx was normal. The proximal esophagus and mid esophagus were normal. There were esophageal mucosal changes secondary to established long-segment Barrett's disease present in the distal esophagus. The maximum longitudinal extent of these mucosal changes was 4 cm in length. Mucosa was biopsied with a cold forceps for histology randomly proximal and distal areas. A total of 2 specimen bottles were sent to pathology. Presence of BE but no dysplasia The Z-line was irregular and was  found 35 cm from the incisors. A 3 cm hiatal hernia was present. The entire examined stomach was normal. The duodenal bulb and second portion of the duodenum were normal.  Last Colonoscopy: 11/2019 - a 6 mm removed from cecum - TA.    She was advised to repeat in 5 years.  Past Medical History: Past Medical History:  Diagnosis Date   Dermatofibrosarcoma protuberans    right dorsal foot tumor   Fibrocystic breast    GERD (gastroesophageal reflux disease)    Hiatal hernia    History of esophageal stricture    w/ dilatation 01-/ 2018   History of HPV infection    Osteopenia after menopause 2019   Psoriasis    back of scalp   Skin cancer 12/2021   Wears glasses     Past Surgical History: Past Surgical History:  Procedure Laterality Date   BIOPSY  03/22/2016   Procedure: BIOPSY;  Surgeon: Malissa Hippo, MD;  [facility];  Service: Endoscopy;;  esophagus   BIOPSY N/A 09/01/2016   Procedure: BIOPSY;  Surgeon: Malissa Hippo, MD;  [facility];  Service: Endoscopy;  Laterality: N/A;  esophageal   BIOPSY  12/04/2019   Procedure: BIOPSY;  Surgeon: Malissa Hippo, MD;  [facility];  Service: Endoscopy;;   CHOLECYSTECTOMY OPEN  1980   and Appendectomy   COLONOSCOPY N/A 12/04/2019   Procedure: COLONOSCOPY;  Surgeon: Malissa Hippo, MD;  [facility];  Service: Endoscopy;  Laterality: N/A;  145   D & C HYSTEROSCOPY W/ ENDOMETRIAL ABLATION  06/30/2005   ESOPHAGEAL DILATION N/A 03/22/2016   Procedure: ESOPHAGEAL DILATION;  Surgeon: Malissa Hippo, MD;  [facility];  Service: Endoscopy;  Laterality: N/A;   ESOPHAGOGASTRODUODENOSCOPY N/A 03/22/2016   Procedure: ESOPHAGOGASTRODUODENOSCOPY (EGD);  Surgeon: Malissa Hippo, MD;  [facility];  Service: Endoscopy;  Laterality: N/A;  1:45   ESOPHAGOGASTRODUODENOSCOPY N/A 09/01/2016   Procedure: ESOPHAGOGASTRODUODENOSCOPY (EGD);  Surgeon: Malissa Hippo, MD;   [facility];  Service: Endoscopy;  Laterality: N/A;  1230   ESOPHAGOGASTRODUODENOSCOPY N/A 12/04/2019   Procedure: ESOPHAGOGASTRODUODENOSCOPY (EGD);  Surgeon: Malissa Hippo, MD;  [facility];  Service: Endoscopy;  Laterality: N/A;   EXCISION LESION FOOT Right 07/04/2016   Procedure: WIDE LOCAL EXCISION OF RIGHT DORSAL FOOT;  Surgeon: Forde Dandy, MD;  Location: [facility];  Service: General;  Laterality: Right;   EXCISION OF BREAST BIOPSY Left 03/07/1999   w/ needle localization (benign)   SHOULDER ARTHROSCOPY W/ ROTATOR CUFF REPAIR Right 2003  approx.   skin cancer removed  12/2021   TUBAL LIGATION Bilateral yrs ago    Family History: Family History  Problem Relation Age of Onset   Hypertension Mother    Osteoporosis Mother    Cancer Father        prostate cancer    Social History: Social History   Tobacco Use  Smoking Status Every Day   Current packs/day: 0.00   Types: Cigarettes   Passive exposure: Current  Smokeless Tobacco Never  Tobacco Comments   1 cigarettes /day.    Social History   Substance and Sexual Activity  Alcohol Use Yes   Comment: 1-2 mixed drinks a week   Social History   Substance and Sexual Activity  Drug Use No    Allergies: No Known Allergies  Medications: Current Outpatient Medications  Medication Sig Dispense Refill   famotidine (PEPCID) 40 MG tablet Take 1 tablet (40 mg total) by mouth at bedtime. 90 tablet 3   pantoprazole (PROTONIX) 40 MG tablet TAKE 1 TABLET BY MOUTH EVERY DAY 90 tablet 1   VITAMIN D PO Take by mouth daily at 6 (six) AM.     No current facility-administered medications for this visit.    Review of Systems: GENERAL: negative for malaise, night sweats HEENT: No changes in hearing or vision, no nose bleeds or other nasal problems. NECK: Negative for lumps, goiter, pain and significant neck swelling RESPIRATORY: Negative for cough, wheezing CARDIOVASCULAR: Negative  for chest pain, leg swelling, palpitations, orthopnea GI: SEE HPI MUSCULOSKELETAL: Negative for joint pain or swelling, back pain, and muscle pain. SKIN: Negative for lesions, rash PSYCH: Negative for sleep disturbance, mood disorder and recent psychosocial stressors. HEMATOLOGY Negative for prolonged bleeding, bruising easily, and swollen nodes. ENDOCRINE: Negative for cold or heat intolerance, polyuria, polydipsia and goiter. NEURO: negative for tremor, gait imbalance, syncope and seizures. The remainder of the review of systems is noncontributory.   Physical Exam: BP 111/75 (BP Location: Left Arm, Patient Position: Sitting, Cuff Size: Large)   Pulse 80   Temp (!) 97.5 F (36.4 C) (Temporal)   Ht 5\' 5"  (1.651 m)   Wt 239 lb 1.6 oz (108.5 kg)   BMI 39.79 kg/m  GENERAL: The patient is AO x3, in no acute distress. HEENT: Head is normocephalic and atraumatic. EOMI are intact. Mouth is well hydrated and without lesions. NECK: Supple. No masses LUNGS: Clear to auscultation. No presence of rhonchi/wheezing/rales. Adequate chest expansion HEART: RRR, normal s1 and s2. ABDOMEN: Soft, nontender, no guarding, no peritoneal signs, and nondistended. BS +.  No masses. EXTREMITIES: Without any cyanosis, clubbing, rash, lesions or edema. NEUROLOGIC: AOx3, no focal motor deficit. SKIN: no jaundice, no rashes  Imaging/Labs: as above  I personally reviewed and interpreted the available labs, imaging and endoscopic files.  Impression and Plan: Sarah Avila is a 63 y.o. female with past medical history of dermatofibrosarcoma, GERD complicated by nondysplastic Barrett's esophagus, hiatal hernia, who presents for follow up of GERD, Barrett's esophagus and bloating.  The patient has presented adequate control of her GERD symptoms on a combination of PPI and night famotidine.  Denies any complaints at the moment.  She is due for surveillance of Barrett's esophagus as she had long segment Barrett's  esophagus.  Will schedule EGD.  She will be continued on same regimen of medications.  I encouraged her to go back to her previous diet to avoid abdominal discomfort episodes.  -Continue Protonix 40 mg daily and famotidine 40 mg at night. -Schedule EGD  All questions were answered.      Sarah Blazing, MD Gastroenterology and Hepatology [facility] Gastroenterology

## 2023-02-23 NOTE — H&P (View-Only) (Signed)
Sarah Avila, M.D. Gastroenterology & Hepatology [facility] Gastroenterology [address] [place], [place] [address]  Primary Care Physician: Benita Stabile, MD [address] [place] [address]  I will communicate my assessment and recommendations to the referring MD via EMR.  Problems: Long segment Barrett's esophagus GERD  History of Present Illness: Sarah Avila is a 63 y.o. female with past medical history of dermatofibrosarcoma, GERD complicated by nondysplastic Barrett's esophagus, hiatal hernia, who presents for follow up of GERD, Barrett's esophagus and bloating.  The patient was last seen on 01/30/2022. At that time, the patient was continued on Protonix 40 mg daily and famotidine 40 mg at night.  Patient reports that as long as she takes the Protonix 40 mg and famotidine and 40 mg at night, she will not have heartburn. No dysphagia or odynophagia.    She states having some "gas pain" in both sides of her abdomen for the last couple of weeks. States that she has been eating differently recently as she lost her partner recently.  States that she has been eating more than usual due to these.  Prior to this event, she was not having the gas pain episodes.  The patient denies having any nausea, vomiting, fever, chills, hematochezia, melena, hematemesis, abdominal distention, abdominal pain, diarrhea, jaundice, pruritus or weight loss.  Last EGD:11/2019 The hypopharynx was normal. The proximal esophagus and mid esophagus were normal. There were esophageal mucosal changes secondary to established long-segment Barrett's disease present in the distal esophagus. The maximum longitudinal extent of these mucosal changes was 4 cm in length. Mucosa was biopsied with a cold forceps for histology randomly proximal and distal areas. A total of 2 specimen bottles were sent to pathology. Presence of BE but no dysplasia The Z-line was irregular and was  found 35 cm from the incisors. A 3 cm hiatal hernia was present. The entire examined stomach was normal. The duodenal bulb and second portion of the duodenum were normal.  Last Colonoscopy: 11/2019 - a 6 mm removed from cecum - TA.    She was advised to repeat in 5 years.  Past Medical History: Past Medical History:  Diagnosis Date   Dermatofibrosarcoma protuberans    right dorsal foot tumor   Fibrocystic breast    GERD (gastroesophageal reflux disease)    Hiatal hernia    History of esophageal stricture    w/ dilatation 01-/ 2018   History of HPV infection    Osteopenia after menopause 2019   Psoriasis    back of scalp   Skin cancer 12/2021   Wears glasses     Past Surgical History: Past Surgical History:  Procedure Laterality Date   BIOPSY  03/22/2016   Procedure: BIOPSY;  Surgeon: Malissa Hippo, MD;  [facility];  Service: Endoscopy;;  esophagus   BIOPSY N/A 09/01/2016   Procedure: BIOPSY;  Surgeon: Malissa Hippo, MD;  [facility];  Service: Endoscopy;  Laterality: N/A;  esophageal   BIOPSY  12/04/2019   Procedure: BIOPSY;  Surgeon: Malissa Hippo, MD;  [facility];  Service: Endoscopy;;   CHOLECYSTECTOMY OPEN  1980   and Appendectomy   COLONOSCOPY N/A 12/04/2019   Procedure: COLONOSCOPY;  Surgeon: Malissa Hippo, MD;  [facility];  Service: Endoscopy;  Laterality: N/A;  145   D & C HYSTEROSCOPY W/ ENDOMETRIAL ABLATION  06/30/2005   ESOPHAGEAL DILATION N/A 03/22/2016   Procedure: ESOPHAGEAL DILATION;  Surgeon: Malissa Hippo, MD;  [facility];  Service: Endoscopy;  Laterality: N/A;   ESOPHAGOGASTRODUODENOSCOPY N/A 03/22/2016   Procedure: ESOPHAGOGASTRODUODENOSCOPY (EGD);  Surgeon: Malissa Hippo, MD;  [facility];  Service: Endoscopy;  Laterality: N/A;  1:45   ESOPHAGOGASTRODUODENOSCOPY N/A 09/01/2016   Procedure: ESOPHAGOGASTRODUODENOSCOPY (EGD);  Surgeon: Malissa Hippo, MD;   [facility];  Service: Endoscopy;  Laterality: N/A;  1230   ESOPHAGOGASTRODUODENOSCOPY N/A 12/04/2019   Procedure: ESOPHAGOGASTRODUODENOSCOPY (EGD);  Surgeon: Malissa Hippo, MD;  [facility];  Service: Endoscopy;  Laterality: N/A;   EXCISION LESION FOOT Right 07/04/2016   Procedure: WIDE LOCAL EXCISION OF RIGHT DORSAL FOOT;  Surgeon: Forde Dandy, MD;  Location: [facility];  Service: General;  Laterality: Right;   EXCISION OF BREAST BIOPSY Left 03/07/1999   w/ needle localization (benign)   SHOULDER ARTHROSCOPY W/ ROTATOR CUFF REPAIR Right 2003  approx.   skin cancer removed  12/2021   TUBAL LIGATION Bilateral yrs ago    Family History: Family History  Problem Relation Age of Onset   Hypertension Mother    Osteoporosis Mother    Cancer Father        prostate cancer    Social History: Social History   Tobacco Use  Smoking Status Every Day   Current packs/day: 0.00   Types: Cigarettes   Passive exposure: Current  Smokeless Tobacco Never  Tobacco Comments   1 cigarettes /day.    Social History   Substance and Sexual Activity  Alcohol Use Yes   Comment: 1-2 mixed drinks a week   Social History   Substance and Sexual Activity  Drug Use No    Allergies: No Known Allergies  Medications: Current Outpatient Medications  Medication Sig Dispense Refill   famotidine (PEPCID) 40 MG tablet Take 1 tablet (40 mg total) by mouth at bedtime. 90 tablet 3   pantoprazole (PROTONIX) 40 MG tablet TAKE 1 TABLET BY MOUTH EVERY DAY 90 tablet 1   VITAMIN D PO Take by mouth daily at 6 (six) AM.     No current facility-administered medications for this visit.    Review of Systems: GENERAL: negative for malaise, night sweats HEENT: No changes in hearing or vision, no nose bleeds or other nasal problems. NECK: Negative for lumps, goiter, pain and significant neck swelling RESPIRATORY: Negative for cough, wheezing CARDIOVASCULAR: Negative  for chest pain, leg swelling, palpitations, orthopnea GI: SEE HPI MUSCULOSKELETAL: Negative for joint pain or swelling, back pain, and muscle pain. SKIN: Negative for lesions, rash PSYCH: Negative for sleep disturbance, mood disorder and recent psychosocial stressors. HEMATOLOGY Negative for prolonged bleeding, bruising easily, and swollen nodes. ENDOCRINE: Negative for cold or heat intolerance, polyuria, polydipsia and goiter. NEURO: negative for tremor, gait imbalance, syncope and seizures. The remainder of the review of systems is noncontributory.   Physical Exam: BP 111/75 (BP Location: Left Arm, Patient Position: Sitting, Cuff Size: Large)   Pulse 80   Temp (!) 97.5 F (36.4 C) (Temporal)   Ht 5\' 5"  (1.651 m)   Wt 239 lb 1.6 oz (108.5 kg)   BMI 39.79 kg/m  GENERAL: The patient is AO x3, in no acute distress. HEENT: Head is normocephalic and atraumatic. EOMI are intact. Mouth is well hydrated and without lesions. NECK: Supple. No masses LUNGS: Clear to auscultation. No presence of rhonchi/wheezing/rales. Adequate chest expansion HEART: RRR, normal s1 and s2. ABDOMEN: Soft, nontender, no guarding, no peritoneal signs, and nondistended. BS +.  No masses. EXTREMITIES: Without any cyanosis, clubbing, rash, lesions or edema. NEUROLOGIC: AOx3, no focal motor deficit. SKIN: no jaundice, no rashes  Imaging/Labs: as above  I personally reviewed and interpreted the available labs, imaging and endoscopic files.  Impression and Plan: Sarah Avila is a 63 y.o. female with past medical history of dermatofibrosarcoma, GERD complicated by nondysplastic Barrett's esophagus, hiatal hernia, who presents for follow up of GERD, Barrett's esophagus and bloating.  The patient has presented adequate control of her GERD symptoms on a combination of PPI and night famotidine.  Denies any complaints at the moment.  She is due for surveillance of Barrett's esophagus as she had long segment Barrett's  esophagus.  Will schedule EGD.  She will be continued on same regimen of medications.  I encouraged her to go back to her previous diet to avoid abdominal discomfort episodes.  -Continue Protonix 40 mg daily and famotidine 40 mg at night. -Schedule EGD  All questions were answered.      Sarah Blazing, MD Gastroenterology and Hepatology [facility] Gastroenterology

## 2023-03-21 ENCOUNTER — Other Ambulatory Visit: Payer: Self-pay

## 2023-03-21 ENCOUNTER — Ambulatory Visit (HOSPITAL_COMMUNITY)
Admission: RE | Admit: 2023-03-21 | Discharge: 2023-03-21 | Disposition: A | Discharge status: Home / Self Care | Payer: 59 | Attending: Gastroenterology | Admitting: Gastroenterology

## 2023-03-21 ENCOUNTER — Ambulatory Visit (HOSPITAL_COMMUNITY): Payer: 59 | Admitting: Anesthesiology

## 2023-03-21 ENCOUNTER — Encounter (HOSPITAL_COMMUNITY): Payer: Self-pay | Admitting: Gastroenterology

## 2023-03-21 ENCOUNTER — Encounter (HOSPITAL_COMMUNITY)
Admission: RE | Disposition: A | Discharge status: Home / Self Care | Payer: Self-pay | Source: Home / Self Care | Attending: Gastroenterology

## 2023-03-21 DIAGNOSIS — I10 Essential (primary) hypertension: Secondary | ICD-10-CM | POA: Diagnosis not present

## 2023-03-21 DIAGNOSIS — F17210 Nicotine dependence, cigarettes, uncomplicated: Secondary | ICD-10-CM | POA: Diagnosis not present

## 2023-03-21 DIAGNOSIS — K449 Diaphragmatic hernia without obstruction or gangrene: Secondary | ICD-10-CM | POA: Diagnosis not present

## 2023-03-21 DIAGNOSIS — K219 Gastro-esophageal reflux disease without esophagitis: Secondary | ICD-10-CM | POA: Insufficient documentation

## 2023-03-21 DIAGNOSIS — K2270 Barrett's esophagus without dysplasia: Secondary | ICD-10-CM

## 2023-03-21 HISTORY — PX: BIOPSY: SHX5522

## 2023-03-21 HISTORY — PX: ESOPHAGOGASTRODUODENOSCOPY (EGD) WITH PROPOFOL: SHX5813

## 2023-03-21 SURGERY — ESOPHAGOGASTRODUODENOSCOPY (EGD) WITH PROPOFOL
Anesthesia: General

## 2023-03-21 MED ORDER — LIDOCAINE HCL (CARDIAC) PF 100 MG/5ML IV SOSY
PREFILLED_SYRINGE | INTRAVENOUS | Status: DC | PRN
  Administered 2023-03-21: 50 mg via INTRATRACHEAL

## 2023-03-21 MED ORDER — PROPOFOL 500 MG/50ML IV EMUL
INTRAVENOUS | Status: DC | PRN
  Administered 2023-03-21: 200 mg via INTRAVENOUS
  Administered 2023-03-21: 100 mg via INTRAVENOUS

## 2023-03-21 MED ORDER — LACTATED RINGERS IV SOLN: INTRAVENOUS | Status: DC

## 2023-03-21 MED ORDER — LACTATED RINGERS IV SOLN: INTRAVENOUS | Status: DC | PRN

## 2023-03-21 NOTE — Anesthesia Postprocedure Evaluation (Signed)
Anesthesia Post Note  Patient: Sarah Avila  Procedure(s) Performed: ESOPHAGOGASTRODUODENOSCOPY (EGD) WITH PROPOFOL BIOPSY  Patient location during evaluation: Endoscopy Anesthesia Type: General Level of consciousness: awake and alert Pain management: pain level controlled Vital Signs Assessment: post-procedure vital signs reviewed and stable Respiratory status: spontaneous breathing Cardiovascular status: blood pressure returned to baseline and stable Postop Assessment: no apparent nausea or vomiting Anesthetic complications: no   No notable events documented.   Last Vitals:  Vitals:   03/21/23 0729  BP: (!) 130/95  Pulse: 77  Resp: 15  Temp: 36.8 C  SpO2: 97%    Last Pain:  Vitals:   03/21/23 0953  TempSrc:   PainSc: 0-No pain                 Gaberiel Youngblood

## 2023-03-21 NOTE — Interval H&P Note (Signed)
History and Physical Interval Note:  03/21/2023 7:57 AM  Sarah Avila  has presented today for surgery, with the diagnosis of Barretts Esophagus.  The various methods of treatment have been discussed with the patient and family. After consideration of risks, benefits and other options for treatment, the patient has consented to  Procedure(s) with comments: ESOPHAGOGASTRODUODENOSCOPY (EGD) WITH PROPOFOL (N/A) - 1:15pm;asa 1 as a surgical intervention.  The patient's history has been reviewed, patient examined, no change in status, stable for surgery.  I have reviewed the patient's chart and labs.  Questions were answered to the patient's satisfaction.     [name]

## 2023-03-21 NOTE — Op Note (Signed)
[facility] Patient Name: Sarah Avila Procedure Date: 03/21/2023 9:45 AM MRN: [id number] Date of Birth: [birth year] Attending MD: Katrinka Blazing , , [id number] CSN: [id number] Age: 63 Admit Type: Outpatient Procedure:                Upper GI endoscopy Indications:              Follow-up of Barrett's esophagus Providers:                Katrinka Blazing, Edrick Kins, RN, Elinor Parkinson Referring MD:             Katrinka Blazing Medicines:                Monitored Anesthesia Care Complications:            No immediate complications. Estimated Blood Loss:     Estimated blood loss: none. Procedure:                Pre-Anesthesia Assessment:                           - Prior to the procedure, a History and Physical                            was performed, and patient medications, allergies                            and sensitivities were reviewed. The patient's                            tolerance of previous anesthesia was reviewed.                           - The risks and benefits of the procedure and the                            sedation options and risks were discussed with the                            patient. All questions were answered and informed                            consent was obtained.                           - ASA Grade Assessment: II - A patient with mild                            systemic disease.                           After obtaining informed consent, the endoscope was                            passed under direct vision. Throughout the                            procedure, the  patient's blood pressure, pulse, and                            oxygen saturations were monitored continuously. The                            [id number]-[id number] ([id number]) scope was introduced through the                            mouth, and advanced to the second part of duodenum.                            The upper GI endoscopy was accomplished without                             difficulty. The patient tolerated the procedure                            well. Scope In: 9:56:53 AM Scope Out: 10:10:15 AM Total Procedure Duration: 0 hours 13 minutes 22 seconds  Findings:      The esophagus and gastroesophageal junction were examined with white       light and narrow band imaging (NBI). There were esophageal mucosal       changes consistent with long-segment Barrett's esophagus, classified as       Barrett's stage C3-M4 per Prague criteria. These changes involved the       mucosa at the upper extent of the gastric folds (36 cm from the       incisors) extending to the Z-line (32 cm from the incisors).       Salmon-colored mucosa was present. The maximum longitudinal extent of       these esophageal mucosal changes was 4 cm in length. Mucosa was biopsied       with a cold forceps for histology in 4 quadrants at intervals of 1 cm in       the lower third of the esophagus. A total of 4 specimen bottles were       sent to pathology.      A 3 cm hiatal hernia was present.      The stomach was normal.      The examined duodenum was normal. Impression:               - Esophageal mucosal changes consistent with                            long-segment Barrett's esophagus, classified as                            Barrett's stage C3-M4 per Prague criteria. Biopsied.                           - 3 cm hiatal hernia.                           - Normal stomach.                           -  Normal examined duodenum. Moderate Sedation:      Per Anesthesia Care Recommendation:           - Discharge patient to home (ambulatory).                           - Resume previous diet.                           - Await pathology results.                           - Continue present medications.                           - Repeat upper endoscopy after studies are complete                            for surveillance. Procedure Code(s):        --- Professional ---                            [phone], Esophagogastroduodenoscopy, flexible,                            transoral; with biopsy, single or multiple Diagnosis Code(s):        --- Professional ---                           K22.70, Barrett's esophagus without dysplasia                           K44.9, Diaphragmatic hernia without obstruction or                            gangrene CPT copyright 2022 [employer]. All rights reserved. The codes documented in this report are preliminary and upon coder review may  be revised to meet current compliance requirements. Katrinka Blazing, MD Katrinka Blazing,  03/21/2023 10:15:44 AM This report has been signed electronically. Number of Addenda: 0

## 2023-03-21 NOTE — Anesthesia Preprocedure Evaluation (Signed)
Anesthesia Evaluation  Patient identified by MRN, date of birth, ID band Patient awake    Reviewed: Allergy & Precautions, H&P , NPO status , Patient's Chart, lab work & pertinent test results, reviewed documented beta blocker date and time   Airway Mallampati: II  TM Distance: >3 FB Neck ROM: full    Dental no notable dental hx.    Pulmonary neg pulmonary ROS, Current Smoker   Pulmonary exam normal breath sounds clear to auscultation       Cardiovascular Exercise Tolerance: Good hypertension, negative cardio ROS  Rhythm:regular Rate:Normal     Neuro/Psych  Neuromuscular disease negative neurological ROS  negative psych ROS   GI/Hepatic negative GI ROS, Neg liver ROS, hiatal hernia,GERD  ,,  Endo/Other  negative endocrine ROS    Renal/GU negative Renal ROS  negative genitourinary   Musculoskeletal   Abdominal   Peds  Hematology negative hematology ROS (+)   Anesthesia Other Findings   Reproductive/Obstetrics negative OB ROS                             Anesthesia Physical Anesthesia Plan  ASA: 2  Anesthesia Plan: General   Post-op Pain Management:    Induction:   PONV Risk Score and Plan: Propofol infusion  Airway Management Planned:   Additional Equipment:   Intra-op Plan:   Post-operative Plan:   Informed Consent: I have reviewed the patients History and Physical, chart, labs and discussed the procedure including the risks, benefits and alternatives for the proposed anesthesia with the patient or authorized representative who has indicated his/her understanding and acceptance.     Dental Advisory Given  Plan Discussed with: CRNA  Anesthesia Plan Comments:        Anesthesia Quick Evaluation

## 2023-03-21 NOTE — Transfer of Care (Signed)
Immediate Anesthesia Transfer of Care Note  Patient: Sarah Avila  Procedure(s) Performed: ESOPHAGOGASTRODUODENOSCOPY (EGD) WITH PROPOFOL BIOPSY  Patient Location: Endoscopy Unit  Anesthesia Type:General  Level of Consciousness: awake  Airway & Oxygen Therapy: Patient Spontanous Breathing  Post-op Assessment: Report given to RN  Post vital signs: Reviewed and stable  Last Vitals:  Vitals Value Taken Time  BP    Temp    Pulse    Resp    SpO2      Last Pain:  Vitals:   03/21/23 0953  TempSrc:   PainSc: 0-No pain      Patients Stated Pain Goal: 5 (03/21/23 0729)  Complications: No notable events documented.

## 2023-03-21 NOTE — Discharge Instructions (Addendum)
You are being discharged to home.  Resume your previous diet.  We are waiting for your pathology results.  Continue your present medications.  Your physician has recommended a repeat upper endoscopy after studies are complete for surveillance.

## 2023-03-22 ENCOUNTER — Encounter (HOSPITAL_COMMUNITY): Payer: Self-pay | Admitting: Gastroenterology

## 2023-03-22 ENCOUNTER — Encounter (INDEPENDENT_AMBULATORY_CARE_PROVIDER_SITE_OTHER): Payer: Self-pay | Admitting: *Deleted

## 2023-03-22 LAB — SURGICAL PATHOLOGY

## 2023-04-25 ENCOUNTER — Other Ambulatory Visit (INDEPENDENT_AMBULATORY_CARE_PROVIDER_SITE_OTHER): Payer: Self-pay | Admitting: Gastroenterology

## 2023-09-17 ENCOUNTER — Other Ambulatory Visit (HOSPITAL_COMMUNITY): Payer: Self-pay | Admitting: Adult Health

## 2023-09-17 DIAGNOSIS — Z1231 Encounter for screening mammogram for malignant neoplasm of breast: Secondary | ICD-10-CM

## 2023-09-20 ENCOUNTER — Encounter (HOSPITAL_COMMUNITY): Payer: Self-pay

## 2023-09-20 ENCOUNTER — Ambulatory Visit (HOSPITAL_COMMUNITY)
Admission: RE | Admit: 2023-09-20 | Discharge: 2023-09-20 | Disposition: A | Discharge status: Home / Self Care | Source: Ambulatory Visit | Attending: Adult Health | Admitting: Adult Health

## 2023-09-20 DIAGNOSIS — Z1231 Encounter for screening mammogram for malignant neoplasm of breast: Secondary | ICD-10-CM | POA: Diagnosis present

## 2023-09-24 ENCOUNTER — Ambulatory Visit: Payer: Self-pay | Admitting: Adult Health

## 2023-10-22 DIAGNOSIS — U071 COVID-19: Secondary | ICD-10-CM

## 2023-10-22 HISTORY — DX: COVID-19: U07.1

## 2023-11-22 ENCOUNTER — Ambulatory Visit (INDEPENDENT_AMBULATORY_CARE_PROVIDER_SITE_OTHER): Admitting: Adult Health

## 2023-11-22 ENCOUNTER — Encounter: Payer: Self-pay | Admitting: Adult Health

## 2023-11-22 VITALS — BP 120/82 | HR 98 | Ht 65.0 in | Wt 251.0 lb

## 2023-11-22 DIAGNOSIS — Z1211 Encounter for screening for malignant neoplasm of colon: Secondary | ICD-10-CM

## 2023-11-22 DIAGNOSIS — Z1331 Encounter for screening for depression: Secondary | ICD-10-CM | POA: Diagnosis not present

## 2023-11-22 DIAGNOSIS — Z01419 Encounter for gynecological examination (general) (routine) without abnormal findings: Secondary | ICD-10-CM | POA: Diagnosis not present

## 2023-11-22 DIAGNOSIS — Z780 Asymptomatic menopausal state: Secondary | ICD-10-CM

## 2023-11-22 LAB — HEMOCCULT GUIAC POC 1CARD (OFFICE): Fecal Occult Blood, POC: NEGATIVE

## 2023-11-22 NOTE — Progress Notes (Signed)
 Patient ID: Sarah Avila, female   DOB: 06-Nov-1960, 63 y.o.   MRN: [id number] History of Present Illness: Sarah Avila is a 63 year old white female, divorced, PM in for a well woman gyn exam. She had COVID in September, and is finally feeling better.  She has had skin cancer, SCC.     Component Value Date/Time   DIAGPAP  08/28/2022 1345    - Negative for intraepithelial lesion or malignancy (NILM)   DIAGPAP  05/08/2019 9166    - Negative for intraepithelial lesion or malignancy (NILM)   DIAGPAP  02/01/2016 0000    NEGATIVE FOR INTRAEPITHELIAL LESIONS OR MALIGNANCY.   HPVHIGH Negative 08/28/2022 1345   HPVHIGH Negative 05/08/2019 0833   ADEQPAP  08/28/2022 1345    Satisfactory for evaluation; transformation zone component PRESENT.   ADEQPAP  05/08/2019 0833    Satisfactory for evaluation; transformation zone component PRESENT.   ADEQPAP  02/01/2016 0000    Satisfactory for evaluation  endocervical/transformation zone component PRESENT.    PCP is Dr Shona   Current Medications, Allergies, Past Medical History, Past Surgical History, Family History and Social History were reviewed in [employer] electronic medical record.     Review of Systems: Patient denies any headaches, hearing loss, fatigue, blurred vision, shortness of breath, chest pain, abdominal pain, problems with bowel movements, urination, or intercourse(not active). No joint pain or mood swings.  No vaginal bleeding    Physical Exam:BP 120/82 (BP Location: Left Arm, Patient Position: Sitting, Cuff Size: Large)   Pulse 98   Ht 5' 5 (1.651 m)   Wt 251 lb (113.9 kg)   BMI 41.77 kg/m   General:  Well developed, well nourished, no acute distress Skin:  Warm and dry Neck:  Midline trachea, normal thyroid, good ROM, no lymphadenopathy, no carotid bruits heard  Lungs; Clear to auscultation bilaterally Breast:  No dominant palpable mass, retraction, or nipple discharge Cardiovascular: Regular rate and rhythm Abdomen:  Soft,  non tender, no hepatosplenomegaly Pelvic:  External genitalia is normal in appearance, no lesions.  The vagina is pale.  Urethra has no lesions or masses. The cervix is smooth.  Uterus is felt to be normal size, shape, and contour.  No adnexal masses or tenderness noted.Bladder is non tender, no masses felt. Rectal: Good sphincter tone, no polyps, or hemorrhoids felt.  Hemoccult negative. Extremities/musculoskeletal:  No swelling or varicosities noted, no clubbing or cyanosis Psych:  No mood changes, alert and cooperative,seems happy AA is 1 Fall risk is low    11/22/2023    3:30 PM 08/28/2022    1:37 PM 07/29/2020    9:31 AM  Depression screen PHQ 2/9  Decreased Interest 2 0 0  Down, Depressed, Hopeless 1 0 0  PHQ - 2 Score 3 0 0  Altered sleeping 2 1 0  Tired, decreased energy 3 1 1   Change in appetite 1 3 1   Feeling bad or failure about yourself  0 1 0  Trouble concentrating 0 0 0  Moving slowly or fidgety/restless 0 0 0  Suicidal thoughts 0 0 0  PHQ-9 Score 9 6 2        11/22/2023    3:30 PM 08/28/2022    1:37 PM 07/29/2020    9:31 AM  GAD 7 : Generalized Anxiety Score  Nervous, Anxious, on Edge 0 1 1  Control/stop worrying 0 0 0  Worry too much - different things 1 0 0  Trouble relaxing 0 1 0  Restless 0 0 0  Easily annoyed or irritable 0 0 0  Afraid - awful might happen 0 0 0  Total GAD 7 Score 1 2 1       Upstream - 11/22/23 1540       Pregnancy Intention Screening   Does the patient want to become pregnant in the next year? N/A    Does the patient's partner want to become pregnant in the next year? N/A    Would the patient like to discuss contraceptive options today? N/A      Contraception Wrap Up   Current Method Female Sterilization    End Method Female Sterilization    Contraception Counseling Provided No         Examination chaperoned by Clarita Salt LPN   Impression and plan: 1. Encounter for well woman exam with routine gynecological exam (Primary) Pap in  2027 Physical in 1 year Labs with PCP Mammogram was negative 09/20/23 Colonoscopy per GI   2. Encounter for screening fecal occult blood testing Hemoccult was negative   3. Postmenopause Denies any bleeding

## 2023-12-05 ENCOUNTER — Encounter (INDEPENDENT_AMBULATORY_CARE_PROVIDER_SITE_OTHER): Payer: Self-pay | Admitting: Gastroenterology

## 2024-02-25 DIAGNOSIS — M79669 Pain in unspecified lower leg: Secondary | ICD-10-CM

## 2024-02-25 DIAGNOSIS — Z87891 Personal history of nicotine dependence: Secondary | ICD-10-CM

## 2024-02-27 ENCOUNTER — Ambulatory Visit (HOSPITAL_COMMUNITY)
Admission: RE | Admit: 2024-02-27 | Discharge: 2024-02-27 | Disposition: A | Discharge status: Home / Self Care | Source: Ambulatory Visit | Attending: Family Medicine | Admitting: Family Medicine

## 2024-02-27 DIAGNOSIS — M79669 Pain in unspecified lower leg: Secondary | ICD-10-CM | POA: Insufficient documentation

## 2024-03-06 ENCOUNTER — Encounter (INDEPENDENT_AMBULATORY_CARE_PROVIDER_SITE_OTHER): Payer: Self-pay | Admitting: Gastroenterology

## 2024-03-07 ENCOUNTER — Ambulatory Visit (HOSPITAL_COMMUNITY)
Admission: RE | Admit: 2024-03-07 | Discharge: 2024-03-07 | Disposition: A | Discharge status: Home / Self Care | Source: Ambulatory Visit | Attending: Family Medicine | Admitting: Family Medicine

## 2024-03-07 DIAGNOSIS — Z87891 Personal history of nicotine dependence: Secondary | ICD-10-CM | POA: Insufficient documentation
